# Patient Record
Sex: Female | Born: 1980 | Race: White | Hispanic: No | Marital: Married | Smoking: Never smoker
Health system: Southern US, Community
[De-identification: ages and names within clinical notes are randomized; demographics above are authoritative.]

## PROBLEM LIST (undated history)

## (undated) DIAGNOSIS — F99 Mental disorder, not otherwise specified: Secondary | ICD-10-CM

## (undated) DIAGNOSIS — M797 Fibromyalgia: Secondary | ICD-10-CM

## (undated) DIAGNOSIS — R87629 Unspecified abnormal cytological findings in specimens from vagina: Secondary | ICD-10-CM

## (undated) DIAGNOSIS — M519 Unspecified thoracic, thoracolumbar and lumbosacral intervertebral disc disorder: Secondary | ICD-10-CM

## (undated) DIAGNOSIS — M1990 Unspecified osteoarthritis, unspecified site: Secondary | ICD-10-CM

## (undated) HISTORY — PX: CRYOTHERAPY: SHX1416

## (undated) HISTORY — DX: Unspecified abnormal cytological findings in specimens from vagina: R87.629

## (undated) HISTORY — DX: Unspecified thoracic, thoracolumbar and lumbosacral intervertebral disc disorder: M51.9

## (undated) HISTORY — DX: Mental disorder, not otherwise specified: F99

---
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
## 2009-11-07 ENCOUNTER — Ambulatory Visit: Payer: Self-pay | Admitting: Obstetrics and Gynecology

## 2009-11-07 ENCOUNTER — Inpatient Hospital Stay (HOSPITAL_COMMUNITY): Admission: AD | Admit: 2009-11-07 | Discharge: 2009-11-07 | Payer: Self-pay | Admitting: Obstetrics & Gynecology

## 2010-05-04 ENCOUNTER — Inpatient Hospital Stay (HOSPITAL_COMMUNITY): Admission: AD | Admit: 2010-05-04 | Discharge: 2010-05-04 | Payer: Self-pay | Admitting: Obstetrics & Gynecology

## 2010-05-04 ENCOUNTER — Ambulatory Visit: Payer: Self-pay | Admitting: Family Medicine

## 2010-05-28 ENCOUNTER — Ambulatory Visit: Payer: Self-pay | Admitting: Family Medicine

## 2010-05-28 ENCOUNTER — Inpatient Hospital Stay (HOSPITAL_COMMUNITY): Admission: AD | Admit: 2010-05-28 | Discharge: 2010-05-29 | Payer: Self-pay | Admitting: Obstetrics & Gynecology

## 2010-06-27 ENCOUNTER — Ambulatory Visit: Payer: Self-pay | Admitting: Obstetrics and Gynecology

## 2010-06-27 ENCOUNTER — Inpatient Hospital Stay (HOSPITAL_COMMUNITY): Admission: AD | Admit: 2010-06-27 | Discharge: 2010-06-30 | Payer: Self-pay | Admitting: Obstetrics and Gynecology

## 2010-07-02 ENCOUNTER — Ambulatory Visit: Admission: RE | Admit: 2010-07-02 | Discharge: 2010-07-02 | Payer: Self-pay | Admitting: Obstetrics and Gynecology

## 2010-12-15 ENCOUNTER — Other Ambulatory Visit: Payer: Self-pay | Admitting: Neurosurgery

## 2010-12-15 DIAGNOSIS — M545 Low back pain, unspecified: Secondary | ICD-10-CM

## 2010-12-15 DIAGNOSIS — M792 Neuralgia and neuritis, unspecified: Secondary | ICD-10-CM

## 2010-12-18 ENCOUNTER — Other Ambulatory Visit: Payer: Self-pay | Admitting: Neurosurgery

## 2010-12-18 ENCOUNTER — Ambulatory Visit
Admission: RE | Admit: 2010-12-18 | Discharge: 2010-12-18 | Disposition: A | Discharge status: Home / Self Care | Payer: BC Managed Care – PPO | Source: Ambulatory Visit | Attending: Neurosurgery | Admitting: Neurosurgery

## 2010-12-18 DIAGNOSIS — M545 Low back pain, unspecified: Secondary | ICD-10-CM

## 2010-12-18 DIAGNOSIS — M792 Neuralgia and neuritis, unspecified: Secondary | ICD-10-CM

## 2011-01-07 LAB — CROSSMATCH
ABO/RH(D): AB NEG
Antibody Screen: NEGATIVE

## 2011-01-07 LAB — DIC (DISSEMINATED INTRAVASCULAR COAGULATION)PANEL
Fibrinogen: 409 mg/dL (ref 204–475)
INR: 0.98 (ref 0.00–1.49)
Platelets: 208 K/uL (ref 150–400)
Prothrombin Time: 13.2 s (ref 11.6–15.2)
Smear Review: NONE SEEN
aPTT: 23 s — ABNORMAL LOW (ref 24–37)

## 2011-01-07 LAB — RH IMMUNE GLOB WKUP(>/=20WKS)(NOT WOMEN'S HOSP): Fetal Screen: NEGATIVE

## 2011-01-07 LAB — CBC
HCT: 37.3 % (ref 36.0–46.0)
Hemoglobin: 13.1 g/dL (ref 12.0–15.0)
Hemoglobin: 8.6 g/dL — ABNORMAL LOW (ref 12.0–15.0)
MCH: 35.7 pg — ABNORMAL HIGH (ref 26.0–34.0)
MCHC: 35 g/dL (ref 30.0–36.0)
MCV: 101.9 fL — ABNORMAL HIGH (ref 78.0–100.0)
MCV: 103 fL — ABNORMAL HIGH (ref 78.0–100.0)
Platelets: 169 10*3/uL (ref 150–400)
Platelets: 202 10*3/uL (ref 150–400)
Platelets: 219 K/uL (ref 150–400)
RBC: 2.42 MIL/uL — ABNORMAL LOW (ref 3.87–5.11)
RBC: 3.66 MIL/uL — ABNORMAL LOW (ref 3.87–5.11)
RDW: 13.6 % (ref 11.5–15.5)
RDW: 13.6 % (ref 11.5–15.5)
WBC: 11.9 K/uL — ABNORMAL HIGH (ref 4.0–10.5)
WBC: 23.6 10*3/uL — ABNORMAL HIGH (ref 4.0–10.5)

## 2011-01-08 LAB — WET PREP, GENITAL
Clue Cells Wet Prep HPF POC: NONE SEEN
Trich, Wet Prep: NONE SEEN

## 2011-01-10 LAB — URINALYSIS, ROUTINE W REFLEX MICROSCOPIC
Bilirubin Urine: NEGATIVE
Glucose, UA: NEGATIVE mg/dL
Hgb urine dipstick: NEGATIVE
Ketones, ur: NEGATIVE mg/dL
Protein, ur: NEGATIVE mg/dL
pH: 7 (ref 5.0–8.0)

## 2011-01-10 LAB — RH IMMUNE GLOBULIN WORKUP (NOT WOMEN'S HOSP): Antibody Screen: NEGATIVE

## 2011-01-10 LAB — WET PREP, GENITAL
Clue Cells Wet Prep HPF POC: NONE SEEN
Trich, Wet Prep: NONE SEEN

## 2011-06-07 ENCOUNTER — Encounter (INDEPENDENT_AMBULATORY_CARE_PROVIDER_SITE_OTHER): Payer: Self-pay | Admitting: Internal Medicine

## 2011-06-07 ENCOUNTER — Ambulatory Visit (INDEPENDENT_AMBULATORY_CARE_PROVIDER_SITE_OTHER): Payer: BC Managed Care – PPO | Admitting: Internal Medicine

## 2011-06-07 ENCOUNTER — Telehealth (INDEPENDENT_AMBULATORY_CARE_PROVIDER_SITE_OTHER): Payer: Self-pay | Admitting: *Deleted

## 2011-06-07 VITALS — BP 132/80 | HR 80 | Temp 97.3°F | Ht 68.0 in | Wt 236.1 lb

## 2011-06-07 DIAGNOSIS — K625 Hemorrhage of anus and rectum: Secondary | ICD-10-CM

## 2011-06-07 NOTE — Telephone Encounter (Signed)
Flex sig sch'd 06/11/11 @ 3:30 (2:30)

## 2011-06-07 NOTE — Progress Notes (Signed)
Subjective:     Patient ID: Christie Morrow, female   DOB: 08/28/1981, 30 y.o.   MRN: [id number]  HPI Christie Morrow is a 30 yr old female referred to our office by Dr.Karen Miki Kins at [facility] in [place] for rectal bleeding. Her rectal bleeding actually started about 3 months ago.   She says she has past clots three times. Usually her stools are not hard.  She feels a lot of pressure in her rectum.  She has had 3 episodes of rectal bleeding. She denies seeing bright red blood on her stool or melena. She usually has a BM every 3-4 days. She recently had a baby September 2011.  Appetite is good. No weight loss.  She does have sharp rt lower abdominal pain which is not constant.  No family hx of colon cancer.   Stool today was soft, pencil thin.   She  says however her stools are usually normal and some are quite large.  She is on constipating medication  (Hydrocodone and Flexeril).  Review of Systems     Objective:   Physical Exam Alert and oriented. Skin warm and dry. Oral mucosa is moist. Natural teeth in good condition. Sclera anicteric, conjunctivae is pink. Thyroid not enlarged. No cervical lymphadenopathy. Lungs clear. Heart regular rate and rhythm.  Abdomen is soft. Bowel sounds are positive. No hepatomegaly. No abdominal masses felt.  Stool was guaiac positive today and bright red blood noted.  No tenderness.  No edema to lower extremities. Patient is alert and oriented.     Assessment:    Rectal bleeding.  Internal hemorrhoid AVM or colonic neoplasm needs to be ruled out      Plan:     Will obtain a CBC today and schedule a sigmoidoscopy with sedation. Stool softner BID   The risks and benefits such as perforation, bleeding, and infection were reviewed with the patient and is agreeable.

## 2011-06-08 ENCOUNTER — Telehealth (INDEPENDENT_AMBULATORY_CARE_PROVIDER_SITE_OTHER): Payer: Self-pay | Admitting: Internal Medicine

## 2011-06-08 LAB — CBC
MCH: 31.6 pg (ref 26.0–34.0)
MCHC: 34.2 g/dL (ref 30.0–36.0)
MCV: 92.2 fL (ref 78.0–100.0)
Platelets: 302 10*3/uL (ref 150–400)
RDW: 12.7 % (ref 11.5–15.5)

## 2011-06-08 NOTE — Telephone Encounter (Signed)
CBC results given to patient. Normal.  Scheduled for Sigmoid on Friday (06/11/2011).

## 2011-06-10 MED ORDER — SODIUM CHLORIDE 0.45 % IV SOLN
Freq: Once | INTRAVENOUS | Status: AC
  Administered 2011-06-11: 15:00:00 via INTRAVENOUS

## 2011-06-11 ENCOUNTER — Encounter (HOSPITAL_COMMUNITY): Payer: Self-pay

## 2011-06-11 ENCOUNTER — Ambulatory Visit (HOSPITAL_COMMUNITY)
Admission: RE | Admit: 2011-06-11 | Discharge: 2011-06-11 | Disposition: A | Discharge status: Home / Self Care | Payer: BC Managed Care – PPO | Source: Ambulatory Visit | Attending: Internal Medicine | Admitting: Internal Medicine

## 2011-06-11 ENCOUNTER — Encounter (HOSPITAL_COMMUNITY)
Admission: RE | Disposition: A | Discharge status: Home / Self Care | Payer: Self-pay | Source: Ambulatory Visit | Attending: Internal Medicine

## 2011-06-11 DIAGNOSIS — K644 Residual hemorrhoidal skin tags: Secondary | ICD-10-CM | POA: Insufficient documentation

## 2011-06-11 DIAGNOSIS — K921 Melena: Secondary | ICD-10-CM | POA: Insufficient documentation

## 2011-06-11 HISTORY — PX: FLEXIBLE SIGMOIDOSCOPY: SHX5431

## 2011-06-11 SURGERY — SIGMOIDOSCOPY, FLEXIBLE
Anesthesia: Moderate Sedation

## 2011-06-11 MED ORDER — MEPERIDINE HCL 25 MG/ML IJ SOLN
INTRAMUSCULAR | Status: DC | PRN
  Administered 2011-06-11 (×2): 25 mg via INTRAVENOUS

## 2011-06-11 MED ORDER — MIDAZOLAM HCL 5 MG/5ML IJ SOLN
INTRAMUSCULAR | Status: DC | PRN
  Administered 2011-06-11 (×2): 2 mg via INTRAVENOUS
  Administered 2011-06-11: 1 mg via INTRAVENOUS

## 2011-06-11 MED ORDER — MEPERIDINE HCL 50 MG/ML IJ SOLN
INTRAMUSCULAR | Status: AC
  Filled 2011-06-11: qty 1

## 2011-06-11 MED ORDER — MIDAZOLAM HCL 5 MG/5ML IJ SOLN
INTRAMUSCULAR | Status: AC
  Filled 2011-06-11: qty 5

## 2011-06-11 MED ORDER — STERILE WATER FOR IRRIGATION IR SOLN
Status: DC | PRN
  Administered 2011-06-11: 17:00:00

## 2011-06-11 NOTE — H&P (Signed)
Christie Morrow is an 30 y.o. female.   Chief Complaint: For flexible sigmoidoscopy HPI: Patient is a 30 year old Caucasian female who's been experiencing intermittent hematochezia for the last 6 months. It is always bright red blood in her bowel movement she's had some constipation but no diarrhea or abdominal pain. She has a good appetite. Family history is negative for inflammatory bowel disease  Past Medical History  Diagnosis Date  . Disc disorder     Past Surgical History  Procedure Date  . Wisdom tooth extraction 2003    History reviewed. No pertinent family history. Social History:  reports that she has never smoked. She does not have any smokeless tobacco history on file. She reports that she does not drink alcohol or use illicit drugs.  Allergies: No Known Allergies  Medications Prior to Admission  Medication Dose Route Frequency Provider Last Rate Last Dose  . 0.45 % sodium chloride infusion   Intravenous Once Malissa Hippo, MD 20 mL/hr at 06/11/11 1449    . meperidine (DEMEROL) 50 MG/ML injection           . midazolam (VERSED) 5 MG/5ML injection            No current outpatient prescriptions on file as of 06/11/2011.    No results found for this or any previous visit (from the past 48 hour(s)). No results found.  Review of Systems  Gastrointestinal: Positive for constipation. Negative for heartburn, nausea, vomiting, abdominal pain, diarrhea, blood in stool and melena.    Blood pressure 114/73, pulse 97, temperature 98.5 F (36.9 C), temperature source Oral, resp. rate 19, height 5\' 8"  (1.727 m), weight 236 lb (107.049 kg), last menstrual period 05/10/2010, SpO2 98.00%. Physical Exam  Constitutional: She appears well-developed and well-nourished.  HENT:  Mouth/Throat: Oropharynx is clear and moist.  Eyes: Conjunctivae are normal. No scleral icterus.  Neck: No thyromegaly present.  Cardiovascular: Normal rate, regular rhythm and normal heart sounds.   No murmur  heard. Respiratory: Breath sounds normal.  GI: Soft. She exhibits no distension and no mass. There is no tenderness.  Musculoskeletal: She exhibits no edema.  Lymphadenopathy:    She has no cervical adenopathy.  Neurological: She is alert.  Skin: Skin is warm and dry.     Assessment/Plan Recurrent hematochezia. for flexible sigmoidoscopy  Christie Morrow U 06/11/2011, 5:05 PM

## 2011-06-11 NOTE — Op Note (Signed)
FLEXIBLE SIGMOIDOSCOPY  PROCEDURE REPORT  PATIENT:  Christie Morrow  MR#:  [id number] Birthdate:  21-Apr-1981, 30 y.o., female Endoscopist:  Dr. Malissa Hippo, MD Referred By:  Ms. Marrian Salvage PA-C. Procedure Date: 06/11/2011  Procedure:   Flexible sigmoidoscopy  Indications: Recurrent hematochezia over the last 6 Months. Intermittent obstipation  Informed Consent: Procedure and risks were reviewed with the patient and informed consent obtained. Medications:  Demerol 50 mg IV Versed 5 mg IV  Description of procedure:  After a digital rectal exam was performed, that colonoscope was advanced from the anus through the rectum and colon to the area of splenic flexure. As the scope was gradually withdrawn colonic mucosa was carefully examined  was normal.  Preparation was satisfactory except some stool at rectosigmoid junction junction. Rectal mucosa was normal. Scope was retroflexed to examine the anorectal junction. Findings:   Small external hemorrhoids otherwise normal exam splenic flexure.   Therapeutic/Diagnostic Maneuvers Performed:  None  Complications:  None.    Impression:  External hemorrhoids otherwise normal exam to splenic flexure. Hemorrhoids felt to be source of hematochezia.   Recommendations:  High fiber diet. Fiber supplement 4 grams daily. Continue Colace 2 tablets daily Call  with progress report in 3 months  REHMAN,NAJEEB U  06/11/2011 5:26 PM  CC: Ms. Marrian Salvage PA-C.

## 2011-06-17 ENCOUNTER — Encounter (HOSPITAL_COMMUNITY): Payer: Self-pay | Admitting: Internal Medicine

## 2011-06-21 ENCOUNTER — Encounter (INDEPENDENT_AMBULATORY_CARE_PROVIDER_SITE_OTHER): Payer: Self-pay | Admitting: *Deleted

## 2011-06-21 ENCOUNTER — Telehealth (INDEPENDENT_AMBULATORY_CARE_PROVIDER_SITE_OTHER): Payer: Self-pay | Admitting: *Deleted

## 2011-06-21 NOTE — Telephone Encounter (Signed)
She started having diarrhea on Saturday. She had about 12 stools and all were loose. She also has been running a fever but no fever today. She has had 6 loose stools today.  I advised her Imodium BID.  She will call tomorrow. If she is still having loose stools I will get stool studies on her.  Stool WBC, O and P, C-diff, Stool culture and sensitive  Christie Morrow.

## 2011-06-21 NOTE — Telephone Encounter (Signed)
Christie Morrow CALLED IN STATES SHE IS HAVING WATERY DIARRHEA AND HAD A SLIGHT FEVER ON SAT NIGHT,SHE WANTED YOU TO PLEASE CALL HER BACK ON HER CELL PHONE # [phone]

## 2011-07-12 NOTE — Telephone Encounter (Signed)
This encounter was created in error - please disregard.

## 2013-04-05 ENCOUNTER — Emergency Department (HOSPITAL_COMMUNITY)
Admission: EM | Admit: 2013-04-05 | Discharge: 2013-04-05 | Disposition: A | Discharge status: Home / Self Care | Payer: BC Managed Care – PPO | Attending: Emergency Medicine | Admitting: Emergency Medicine

## 2013-04-05 ENCOUNTER — Emergency Department (HOSPITAL_COMMUNITY): Payer: BC Managed Care – PPO

## 2013-04-05 ENCOUNTER — Encounter (HOSPITAL_COMMUNITY): Payer: Self-pay | Admitting: *Deleted

## 2013-04-05 DIAGNOSIS — M7989 Other specified soft tissue disorders: Secondary | ICD-10-CM

## 2013-04-05 DIAGNOSIS — IMO0001 Reserved for inherently not codable concepts without codable children: Secondary | ICD-10-CM | POA: Insufficient documentation

## 2013-04-05 DIAGNOSIS — E876 Hypokalemia: Secondary | ICD-10-CM | POA: Insufficient documentation

## 2013-04-05 DIAGNOSIS — M549 Dorsalgia, unspecified: Secondary | ICD-10-CM | POA: Insufficient documentation

## 2013-04-05 DIAGNOSIS — Z791 Long term (current) use of non-steroidal anti-inflammatories (NSAID): Secondary | ICD-10-CM | POA: Insufficient documentation

## 2013-04-05 DIAGNOSIS — Z8739 Personal history of other diseases of the musculoskeletal system and connective tissue: Secondary | ICD-10-CM | POA: Insufficient documentation

## 2013-04-05 DIAGNOSIS — R0602 Shortness of breath: Secondary | ICD-10-CM | POA: Insufficient documentation

## 2013-04-05 DIAGNOSIS — M1710 Unilateral primary osteoarthritis, unspecified knee: Secondary | ICD-10-CM | POA: Insufficient documentation

## 2013-04-05 DIAGNOSIS — Z79899 Other long term (current) drug therapy: Secondary | ICD-10-CM | POA: Insufficient documentation

## 2013-04-05 DIAGNOSIS — R079 Chest pain, unspecified: Secondary | ICD-10-CM | POA: Insufficient documentation

## 2013-04-05 HISTORY — DX: Fibromyalgia: M79.7

## 2013-04-05 HISTORY — DX: Unspecified osteoarthritis, unspecified site: M19.90

## 2013-04-05 LAB — CBC WITH DIFFERENTIAL/PLATELET
Basophils Absolute: 0 10*3/uL (ref 0.0–0.1)
Eosinophils Absolute: 0.3 10*3/uL (ref 0.0–0.7)
Eosinophils Relative: 3 % (ref 0–5)
Hemoglobin: 12.9 g/dL (ref 12.0–15.0)
Lymphocytes Relative: 29 % (ref 12–46)
MCH: 31.9 pg (ref 26.0–34.0)
MCV: 93.3 fL (ref 78.0–100.0)
Monocytes Absolute: 0.6 10*3/uL (ref 0.1–1.0)
Neutrophils Relative %: 61 % (ref 43–77)
Platelets: 271 10*3/uL (ref 150–400)
RBC: 4.04 MIL/uL (ref 3.87–5.11)
WBC: 8.4 10*3/uL (ref 4.0–10.5)

## 2013-04-05 LAB — BASIC METABOLIC PANEL
Calcium: 9.1 mg/dL (ref 8.4–10.5)
GFR calc Af Amer: >90 mL/min (ref >90)
GFR calc non Af Amer: 87 mL/min — ABNORMAL LOW (ref >90)

## 2013-04-05 LAB — TROPONIN I: Troponin I: <0.3 ng/mL (ref <0.30)

## 2013-04-05 MED ORDER — HYDROMORPHONE HCL PF 1 MG/ML IJ SOLN: 1.0000 mg | Freq: Once | INTRAMUSCULAR | Status: AC

## 2013-04-05 MED ORDER — SODIUM CHLORIDE 0.9 % IV BOLUS (SEPSIS)
250.0000 mL | Freq: Once | INTRAVENOUS | Status: AC
  Administered 2013-04-05: 250 mL via INTRAVENOUS

## 2013-04-05 MED ORDER — POTASSIUM CHLORIDE CRYS ER 20 MEQ PO TBCR: 20.0000 meq | EXTENDED_RELEASE_TABLET | Freq: Two times a day (BID) | ORAL | Status: DC

## 2013-04-05 MED ORDER — HYDROMORPHONE HCL PF 1 MG/ML IJ SOLN
INTRAMUSCULAR | Status: AC
  Administered 2013-04-05: 1 mg via INTRAVENOUS
  Filled 2013-04-05: qty 1

## 2013-04-05 MED ORDER — ONDANSETRON HCL 4 MG/2ML IJ SOLN: 4.0000 mg | Freq: Once | INTRAMUSCULAR | Status: AC

## 2013-04-05 MED ORDER — HYDROCODONE-ACETAMINOPHEN 5-325 MG PO TABS: 1.0000 | ORAL_TABLET | Freq: Four times a day (QID) | ORAL | Status: DC | PRN

## 2013-04-05 MED ORDER — POTASSIUM CHLORIDE CRYS ER 20 MEQ PO TBCR
40.0000 meq | EXTENDED_RELEASE_TABLET | Freq: Once | ORAL | Status: AC
  Administered 2013-04-05: 40 meq via ORAL
  Filled 2013-04-05: qty 2

## 2013-04-05 MED ORDER — HYDROMORPHONE HCL PF 1 MG/ML IJ SOLN
1.0000 mg | Freq: Once | INTRAMUSCULAR | Status: AC
  Administered 2013-04-05: 1 mg via INTRAVENOUS
  Filled 2013-04-05: qty 1

## 2013-04-05 MED ORDER — ONDANSETRON HCL 4 MG/2ML IJ SOLN
INTRAMUSCULAR | Status: AC
  Administered 2013-04-05: 4 mg via INTRAVENOUS
  Filled 2013-04-05: qty 2

## 2013-04-05 MED ORDER — ONDANSETRON HCL 4 MG/2ML IJ SOLN
4.0000 mg | Freq: Once | INTRAMUSCULAR | Status: AC
  Administered 2013-04-05: 4 mg via INTRAVENOUS
  Filled 2013-04-05: qty 2

## 2013-04-05 MED ORDER — SODIUM CHLORIDE 0.9 % IV SOLN
INTRAVENOUS | Status: DC
  Administered 2013-04-05: 23:00:00 via INTRAVENOUS

## 2013-04-05 MED ORDER — HYDROCHLOROTHIAZIDE 25 MG PO TABS: 12.5000 mg | ORAL_TABLET | Freq: Every day | ORAL | Status: DC

## 2013-04-05 MED ORDER — IOHEXOL 350 MG/ML SOLN
100.0000 mL | Freq: Once | INTRAVENOUS | Status: AC | PRN
  Administered 2013-04-05: 100 mL via INTRAVENOUS

## 2013-04-05 NOTE — ED Notes (Signed)
Pt states swelling to bilateral legs since last Thursday with pain, SOB at times, states feet swelling worse this morning, feels like chest is tight

## 2013-04-05 NOTE — ED Notes (Signed)
Pt requesting more pain meds

## 2013-04-05 NOTE — ED Notes (Signed)
Patient states she does not need anything at this time. 

## 2013-04-05 NOTE — ED Provider Notes (Addendum)
History  This chart was scribed for Shelda Jakes, MD by Ardelia Mems, ED Scribe. This patient was seen in room [facility]18[facility] and the patient's care was started at 7:08 PM.   CSN: [id number]  Arrival date & time 04/05/13  1702     Chief Complaint  Patient presents with  . Leg Swelling  . Shortness of Breath     The history is provided by the patient. No language interpreter was used.    HPI Comments: Christie Morrow is a 32 y.o. female who presents to the Emergency Department complaining of bilateral leg swelling with associated constant, moderate pain onset 7 days ago which suddenly worsened this morning. Pt states that left side is more swollen than right side. Pt states that her hands are also swollen. Pt reports mild, intermittent SOB as an associated symptom. Pt states starting this morning she had SOB and had 5/10 severity chest pain described as discomfort, worsened with deep inspiration. Pt states that she spoke to her PCP 3 days ago and was told to begin a low sodium diet and drink plenty of fluids. LNMP was 1 month ago. Pt had a IUD put in 1 month ago. Pt states that she sees a rheumatologist for arthritis of her knees and fibromyalgia. Pt denies fever, diarrhea, headaches or any other symptoms. Pt denies alcohol use and smoking.  PCP- Dr. Marrian Salvage in [place]   Past Medical History  Diagnosis Date  . Disc disorder   . Fibromyalgia   . Arthritis     Past Surgical History  Procedure Laterality Date  . Wisdom tooth extraction  2003  . Flexible sigmoidoscopy  06/11/2011    Procedure: FLEXIBLE SIGMOIDOSCOPY;  Surgeon: Malissa Hippo, MD;  Location: [facility];  Service: Endoscopy;  Laterality: N/A;  3:30    No family history on file.  History  Substance Use Topics  . Smoking status: Never Smoker   . Smokeless tobacco: Not on file  . Alcohol Use: No    OB History   Grav Para Term Preterm Abortions TAB SAB Ect Mult Living                  Review  of Systems  Constitutional: Negative for fever and chills.  HENT: Negative for sore throat, rhinorrhea and neck pain.   Eyes: Negative for visual disturbance.  Respiratory: Positive for shortness of breath. Negative for cough.   Cardiovascular: Positive for chest pain and leg swelling.  Gastrointestinal: Negative for nausea, vomiting, abdominal pain and diarrhea.  Genitourinary: Negative for dysuria and hematuria.  Musculoskeletal: Positive for back pain and arthralgias.  Skin: Negative for rash.  Neurological: Negative for headaches.  Hematological: Does not bruise/bleed easily.  Psychiatric/Behavioral: Negative for confusion.   A complete 10 system review of systems was obtained and all systems are negative except as noted in the HPI and PMH.   Allergies  Review of patient's allergies indicates no known allergies.  Home Medications   Current Outpatient Rx  Name  Route  Sig  Dispense  Refill  . ALPRAZolam (XANAX) 0.5 MG tablet   Oral   Take 0.5 mg by mouth at bedtime as needed for sleep.         Marland Kitchen buPROPion (WELLBUTRIN SR) 150 MG 12 hr tablet   Oral   Take 150 mg by mouth daily.         . cyclobenzaprine (FLEXERIL) 10 MG tablet   Oral   Take 10 mg by mouth 3 (  three) times daily as needed for muscle spasms.         . diclofenac sodium (VOLTAREN) 1 % GEL   Topical   Apply 4 g topically at bedtime.         . DULoxetine (CYMBALTA) 60 MG capsule   Oral   Take 60 mg by mouth daily.         . hydrochlorothiazide (HYDRODIURIL) 25 MG tablet   Oral   Take 25 mg by mouth daily.         . phentermine (ADIPEX-P) 37.5 MG tablet   Oral   Take 37.5 mg by mouth daily.         . traMADol (ULTRAM) 50 MG tablet   Oral   Take 50 mg by mouth every 6 (six) hours as needed for pain.         . Vitamin D, Ergocalciferol, (DRISDOL) 50000 UNITS CAPS   Oral   Take 50,000 Units by mouth every 7 (seven) days. On Friday         . hydrochlorothiazide (HYDRODIURIL) 25 MG  tablet   Oral   Take 0.5 tablets (12.5 mg total) by mouth daily.   14 tablet   1   . HYDROcodone-acetaminophen (NORCO/VICODIN) 5-325 MG per tablet   Oral   Take 1-2 tablets by mouth every 6 (six) hours as needed for pain.   20 tablet   0   . potassium chloride SA (K-DUR,KLOR-CON) 20 MEQ tablet   Oral   Take 1 tablet (20 mEq total) by mouth 2 (two) times daily.   10 tablet   0     Triage Vitals: BP 109/64  Pulse 95  Temp(Src) 97.4 F (36.3 C) (Oral)  Resp 18  Ht 5' 7.5" (1.715 m)  Wt 277 lb (125.646 kg)  BMI 42.72 kg/m2  SpO2 100%  Physical Exam  Nursing note and vitals reviewed. Constitutional: She is oriented to person, place, and time. She appears well-developed and well-nourished.  HENT:  Head: Normocephalic and atraumatic.  Mouth/Throat: Oropharynx is clear and moist.  Eyes: EOM are normal. Pupils are equal, round, and reactive to light.  Neck: Normal range of motion. Neck supple. No tracheal deviation present.  Cardiovascular: Normal rate, regular rhythm, normal heart sounds and intact distal pulses.   No murmur heard. Pulmonary/Chest: Effort normal and breath sounds normal. No respiratory distress.  Abdominal: Soft. Bowel sounds are normal. There is no tenderness.  Musculoskeletal: Normal range of motion. She exhibits no tenderness.  Knees are not warm to touch. Swelling and trace pitting edema to the knees bilaterally.  Neurological: She is alert and oriented to person, place, and time.  Skin: Skin is warm and dry. No rash noted.    ED Course  Procedures (including critical care time)  DIAGNOSTIC STUDIES: Oxygen Saturation is 100% on RA, normal by my interpretation.    COORDINATION OF CARE: 7:30 PM- Pt advised of plan for treatment and pt agrees.   Medications  0.9 %  sodium chloride infusion (not administered)  HYDROmorphone (DILAUDID) injection 1 mg (not administered)  ondansetron (ZOFRAN) injection 4 mg (not administered)  HYDROmorphone  (DILAUDID) 1 MG/ML injection (not administered)  ondansetron (ZOFRAN) 4 MG/2ML injection (not administered)  potassium chloride SA (K-DUR,KLOR-CON) CR tablet 40 mEq (not administered)  sodium chloride 0.9 % bolus 250 mL (0 mLs Intravenous Stopped 04/05/13 2120)  ondansetron (ZOFRAN) injection 4 mg (4 mg Intravenous Given 04/05/13 2014)  HYDROmorphone (DILAUDID) injection 1 mg (1 mg Intravenous Given 04/05/13  2015)  iohexol (OMNIPAQUE) 350 MG/ML injection 100 mL (100 mLs Intravenous Contrast Given 04/05/13 2211)     Labs Reviewed  BASIC METABOLIC PANEL - Abnormal; Notable for the following:    Potassium 3.1 (*)    GFR calc non Af Amer 87 (*)    All other components within normal limits  CBC WITH DIFFERENTIAL  TROPONIN I   Ct Angio Chest Pe W/cm &/or Wo Cm  04/05/2013   *RADIOLOGY REPORT*  Clinical Data: Leg swelling.  Chest pain.  Shortness of breath. The field  CT ANGIOGRAPHY CHEST  Technique:  Multidetector CT imaging of the chest using the standard protocol during bolus administration of intravenous contrast. Multiplanar reconstructed images including MIPs were obtained and reviewed to evaluate the vascular anatomy.  Contrast: OMNIPAQUE IOHEXOL 350 MG/ML SOLN  Comparison: None.  Findings: Technically adequate study with moderately good opacification of the central and proximal segmental pulmonary arteries.  Distal pulmonary arteries are not as well opacified.  No focal filling defect is demonstrated suggesting no evidence of significant central pulmonary embolus.  Small peripheral emboli cannot be entirely excluded based on this study.  Normal heart size.  Normal caliber thoracic aorta.  No significant lymphadenopathy in the chest.  Esophagus is mostly decompressed. Limited visualization of the lungs due to respiratory motion artifact but there appears to be dependent changes in the lungs. Mild posterior edema is not excluded.  No focal consolidation.  No pneumothorax.  No pleural effusions.   Visualized portions of the upper abdominal organs are grossly unremarkable.  Normal alignment of the thoracic vertebrae.  IMPRESSION: No evidence of significant central pulmonary embolus peripheral vessels are not well opacified.  Posterior opacities in the lungs likely represent dependent changes although edema is not excluded.   Original Report Authenticated By: Burman Nieves, M.D.     Date: 04/05/2013  Rate: 97  Rhythm: normal sinus rhythm  QRS Axis: normal  Intervals: normal  ST/T Wave abnormalities: normal  Conduction Disutrbances:none  Narrative Interpretation:   Old EKG Reviewed: none available  Results for orders placed during the hospital encounter of 04/05/13  CBC WITH DIFFERENTIAL      Result Value Range   WBC 8.4  4.0 - 10.5 K/uL   RBC 4.04  3.87 - 5.11 MIL/uL   Hemoglobin 12.9  12.0 - 15.0 g/dL   HCT 78.2  95.6 - 21.3 %   MCV 93.3  78.0 - 100.0 fL   MCH 31.9  26.0 - 34.0 pg   MCHC 34.2  30.0 - 36.0 g/dL   RDW 08.6  57.8 - 46.9 %   Platelets 271  150 - 400 K/uL   Neutrophils Relative % 61  43 - 77 %   Neutro Abs 5.1  1.7 - 7.7 K/uL   Lymphocytes Relative 29  12 - 46 %   Lymphs Abs 2.5  0.7 - 4.0 K/uL   Monocytes Relative 7  3 - 12 %   Monocytes Absolute 0.6  0.1 - 1.0 K/uL   Eosinophils Relative 3  0 - 5 %   Eosinophils Absolute 0.3  0.0 - 0.7 K/uL   Basophils Relative 0  0 - 1 %   Basophils Absolute 0.0  0.0 - 0.1 K/uL  BASIC METABOLIC PANEL      Result Value Range   Sodium 136  135 - 145 mEq/L   Potassium 3.1 (*) 3.5 - 5.1 mEq/L   Chloride 98  96 - 112 mEq/L   CO2 29  19 -  32 mEq/L   Glucose, Bld 96  70 - 99 mg/dL   BUN 6  6 - 23 mg/dL   Creatinine, Ser 9.14  0.50 - 1.10 mg/dL   Calcium 9.1  8.4 - 78.2 mg/dL   GFR calc non Af Amer 87 (*) >90 mL/min   GFR calc Af Amer >90  >90 mL/min  TROPONIN I      Result Value Range   Troponin I <0.30  <0.30 ng/mL     1. Shortness of breath   2. Right leg swelling   3. Left leg swelling   4. Hypokalemia        MDM  Patient with complaint of bilateral lower leg swelling. Some shortness of breath. Doubt deep vein thrombosis however have ordered CT angios chest to rule out pulmonary embolism. Doppler studies are not available no evidence of any renal insufficiency no evidence of an acute cardiac event. No evidence of anemia. CT angios also work to evaluate the rest of the pulmonary system. If negative will discharge and followup with primary care Dr.  Patient with mild hypokalemia. Since we starting on a diuretic we'll start oral potassium supplements first dose given here in the ED. Patient will followup with her primary care Dr. will start the hydrochlorothiazide 12.5 mg daily. Be important for the patient to have a recheck of her potassium the next few days. CT scan no pulmonary embolism but does raise some question may be of some mild edema but with leg swelling the diuretic will be helpful. No evidence of pneumonia no evidence of pleural effusion no evidence of any renal problems.    Correction: Patient is already on hydrochlorothiazide still taking it because she had stopped it so taking the 12 and half of the hydrocortisone as I would not be necessary. Patient will continue her current diuretic and followup with her primary care doctor and take potassium supplements.    I personally performed the services described in this documentation, which was scribed in my presence. The recorded information has been reviewed and is accurate.     Shelda Jakes, MD 04/05/13 2231  Shelda Jakes, MD 04/05/13 2236

## 2013-04-05 NOTE — ED Notes (Signed)
Pt dc'd by Tenna Delaine, RN but not taken off track board.  Pt chart removed from track board at this time.

## 2013-04-05 NOTE — ED Notes (Signed)
Pt waiting on c-t 

## 2013-08-31 ENCOUNTER — Ambulatory Visit (INDEPENDENT_AMBULATORY_CARE_PROVIDER_SITE_OTHER): Payer: Self-pay | Admitting: Surgery

## 2013-10-04 ENCOUNTER — Ambulatory Visit (INDEPENDENT_AMBULATORY_CARE_PROVIDER_SITE_OTHER): Payer: Self-pay | Admitting: Surgery

## 2013-10-10 ENCOUNTER — Encounter (INDEPENDENT_AMBULATORY_CARE_PROVIDER_SITE_OTHER): Payer: Self-pay | Admitting: Surgery

## 2014-05-19 IMAGING — CT CT ANGIO CHEST
1 of 6 series · 5 of 36 positions shown · IV contrast (Omnipaque 300)
Comparison: None.

CLINICAL DATA: Leg swelling.  Chest pain.  Shortness of breath.
The field

CT ANGIOGRAPHY CHEST
TECHNIQUE: Multidetector CT imaging of the chest using the
standard protocol during bolus administration of intravenous
contrast. Multiplanar reconstructed images including MIPs were
obtained and reviewed to evaluate the vascular anatomy.
Contrast: 100mL OMNIPAQUE IOHEXOL 350 MG/ML SOLN

[Series 5: pe 3.0 b40f · axial · 0.74mm/px · z∈[-328,-160]mm · 5 of 85 slices shown]
[im 15/85  lung]
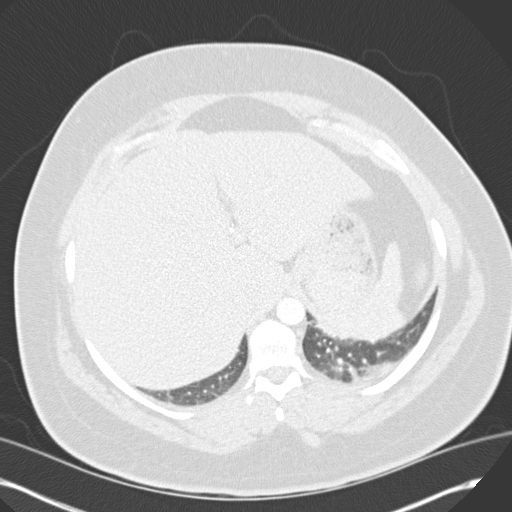
[im 29/85  mediastinal]
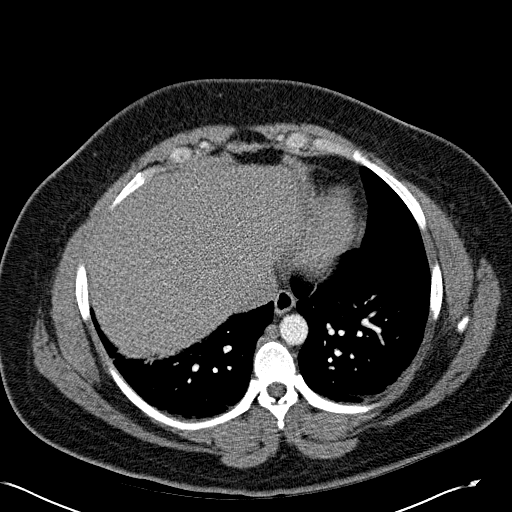
[im 43/85  lung]
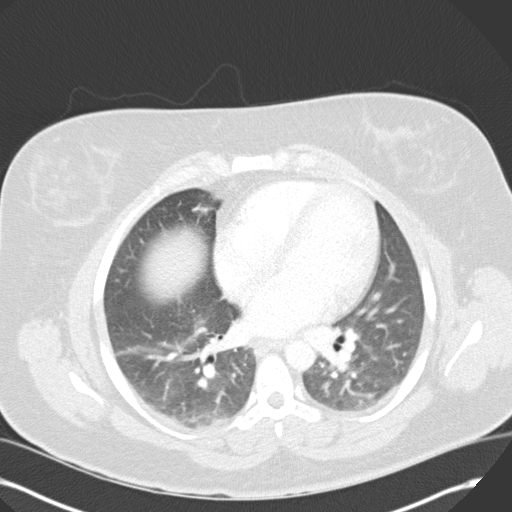
[im 57/85  mediastinal]
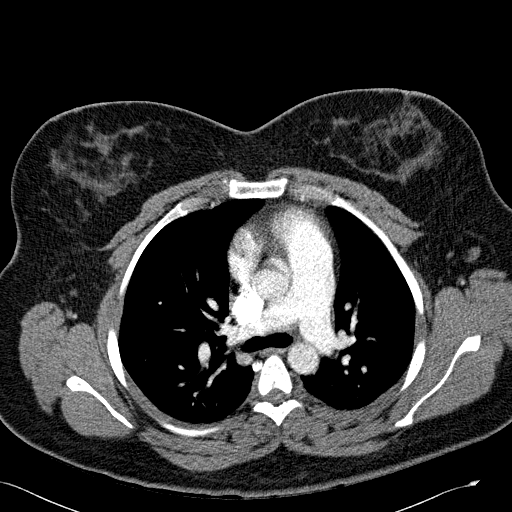
[im 71/85  lung]
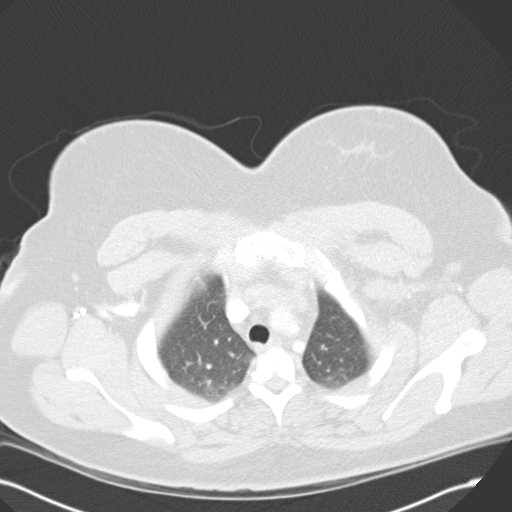

[5 of 36 positions shown; findings below may reference images not displayed]

FINDINGS: Technically adequate study with moderately good
opacification of the central and proximal segmental pulmonary
arteries.  Distal pulmonary arteries are not as well opacified.  No
focal filling defect is demonstrated suggesting no evidence of
significant central pulmonary embolus.  Small peripheral emboli
cannot be entirely excluded based on this study.

Normal heart size.  Normal caliber thoracic aorta.  No significant
lymphadenopathy in the chest.  Esophagus is mostly decompressed.
Limited visualization of the lungs due to respiratory motion
artifact but there appears to be dependent changes in the lungs.
Mild posterior edema is not excluded.  No focal consolidation.  No
pneumothorax.  No pleural effusions.  Visualized portions of the
upper abdominal organs are grossly unremarkable.  Normal alignment
of the thoracic vertebrae.
IMPRESSION: No evidence of significant central pulmonary embolus peripheral
vessels are not well opacified.  Posterior opacities in the lungs
likely represent dependent changes although edema is not excluded.

## 2015-10-06 ENCOUNTER — Encounter: Payer: Self-pay | Admitting: Family Medicine

## 2015-10-06 ENCOUNTER — Ambulatory Visit (INDEPENDENT_AMBULATORY_CARE_PROVIDER_SITE_OTHER): Payer: BLUE CROSS/BLUE SHIELD | Admitting: Family Medicine

## 2015-10-06 VITALS — BP 132/90 | Ht 67.0 in | Wt 315.2 lb

## 2015-10-06 DIAGNOSIS — E785 Hyperlipidemia, unspecified: Secondary | ICD-10-CM | POA: Diagnosis not present

## 2015-10-06 DIAGNOSIS — M797 Fibromyalgia: Secondary | ICD-10-CM | POA: Diagnosis not present

## 2015-10-06 DIAGNOSIS — F329 Major depressive disorder, single episode, unspecified: Secondary | ICD-10-CM | POA: Diagnosis not present

## 2015-10-06 DIAGNOSIS — E559 Vitamin D deficiency, unspecified: Secondary | ICD-10-CM | POA: Diagnosis not present

## 2015-10-06 DIAGNOSIS — Z79899 Other long term (current) drug therapy: Secondary | ICD-10-CM | POA: Diagnosis not present

## 2015-10-06 DIAGNOSIS — I878 Other specified disorders of veins: Secondary | ICD-10-CM | POA: Insufficient documentation

## 2015-10-06 DIAGNOSIS — G4700 Insomnia, unspecified: Secondary | ICD-10-CM

## 2015-10-06 DIAGNOSIS — R5383 Other fatigue: Secondary | ICD-10-CM

## 2015-10-06 DIAGNOSIS — I10 Essential (primary) hypertension: Secondary | ICD-10-CM | POA: Insufficient documentation

## 2015-10-06 DIAGNOSIS — F32A Depression, unspecified: Secondary | ICD-10-CM

## 2015-10-06 MED ORDER — POTASSIUM CHLORIDE CRYS ER 20 MEQ PO TBCR: 20.0000 meq | EXTENDED_RELEASE_TABLET | Freq: Every day | ORAL | Status: DC

## 2015-10-06 MED ORDER — BUPROPION HCL ER (XL) 300 MG PO TB24: 300.0000 mg | ORAL_TABLET | Freq: Every day | ORAL | Status: DC

## 2015-10-06 MED ORDER — CYCLOBENZAPRINE HCL 10 MG PO TABS: 10.0000 mg | ORAL_TABLET | Freq: Three times a day (TID) | ORAL | Status: DC | PRN

## 2015-10-06 MED ORDER — FUROSEMIDE 40 MG PO TABS: 40.0000 mg | ORAL_TABLET | Freq: Every day | ORAL | Status: DC

## 2015-10-06 MED ORDER — DULOXETINE HCL 60 MG PO CPEP: 60.0000 mg | ORAL_CAPSULE | Freq: Every day | ORAL | Status: DC

## 2015-10-06 MED ORDER — ALPRAZOLAM 1 MG PO TABS: 1.0000 mg | ORAL_TABLET | Freq: Every day | ORAL | Status: DC

## 2015-10-06 NOTE — Progress Notes (Signed)
   Subjective:    Patient ID: Christie GrimLanna G Morrow, female    DOB: [birth year], 34 y.o.   MRN: [id number]  patient arrives office with numerous concerns and is a new patient. HPI Patient arrives to get established and refills of current meds. Patient states she feels like her lasix is not working and she is having a lot of water weight gain.  overll health has been good until lately  2013 to 2014 fibromyalgia. Long-standing history of fibromyalgia. Has seen a rheumatologist in the past. Dr. Lannette Donatheborah Schwark. Due to see again. and osteoarthritis  Pt has also hx of disc disease  Pt takes cymbalta for the fibromyalgia , states overall that it helps.   Uses voltaren gel topical, hurts the worse at night  Hx of hairdressing,  And also  Pt prescribed cymbalta by dr Tarri Fullernahoney in [place]  At one pt the hctz stopped working then switched to lasic X needs daily diuretics control blood pressure. Had to switch to Lasix 20 to control edema. States needs higher dose and Lasix 20. Currently not taking the potassium medicine.    insomnia ongoing challenges deftly needs Xanax for this. Has been out lately  Pt has swelling and edema that fluctuates  Hx of worsening weight , patient realizes she is overweight but also thinks a lot of it is fluid.    Thyroid troubles none in family. Pos diabetes. Pos excess cholesterol high blood pressure issues   Review of Systems  positive muscle ache positive gradual weight gain diminished energy no cough no chest pain complete ROS otherwise negative    Objective:   Physical Exam   alert vitals stable blood pressure elevated on repeat. Morbid obesity present HEENT normal sclera heart rare rhythm ankles  Mild 1+ edema bilateral pulses good sensation intact diffuse tender>      Assessment & Plan:   impression 1 morbid obesity discussed at length #2 peripheral edema likely venous stasis discussed #3 hypertension control decent though not ideal #4 fibromyalgia  ongoing multiple meds over 5 depression anxiety substantial discussed no suicidal thoughts. States current medicines helping some but not as much plan diet discussed exercise discussed. Chronic medications refilled. Appropriate blood work. 35- minutes spent most in discussion further recommendations based on bloodwork recheck in several months. Patient also [facility]

## 2015-10-29 ENCOUNTER — Ambulatory Visit (INDEPENDENT_AMBULATORY_CARE_PROVIDER_SITE_OTHER): Payer: BLUE CROSS/BLUE SHIELD | Admitting: Family Medicine

## 2015-10-29 ENCOUNTER — Encounter: Payer: Self-pay | Admitting: Family Medicine

## 2015-10-29 VITALS — BP 100/70 | Temp 99.0°F | Wt 305.0 lb

## 2015-10-29 DIAGNOSIS — J329 Chronic sinusitis, unspecified: Secondary | ICD-10-CM | POA: Diagnosis not present

## 2015-10-29 DIAGNOSIS — J029 Acute pharyngitis, unspecified: Secondary | ICD-10-CM

## 2015-10-29 LAB — POCT RAPID STREP A (OFFICE): RAPID STREP A SCREEN: NEGATIVE

## 2015-10-29 MED ORDER — AMOXICILLIN-POT CLAVULANATE 875-125 MG PO TABS: 1.0000 | ORAL_TABLET | Freq: Two times a day (BID) | ORAL | Status: DC

## 2015-10-29 MED ORDER — HYDROCODONE-HOMATROPINE 5-1.5 MG/5ML PO SYRP: 5.0000 mL | ORAL_SOLUTION | Freq: Every evening | ORAL | Status: DC | PRN

## 2015-10-29 NOTE — Progress Notes (Signed)
   Subjective:    Patient ID: Christie Morrow, female    DOB: 05-31-1981, 35 y.o.   MRN: [id number]  Cough This is a new problem. The current episode started 1 to 4 weeks ago. The problem has been unchanged. The cough is productive of sputum. Associated symptoms include a sore throat. Nothing aggravates the symptoms. Treatments tried: nyquil. The treatment provided no relief.    Headache , frontal in nature. Worse with change of position  Frontal fr coughing  Pos gunky phlegm. Cough day and night. Worse at night. Next  No wheezing Review of Systems  HENT: Positive for sore throat.   Respiratory: Positive for cough.    low-grade fever and diminished energy     Objective:   Physical Exam  Alert, mild malaise. Hydration good Vitals stable. frontal/ maxillary tenderness evident positive nasal congestion. pharynx normal neck supple  lungs clear/no crackles or wheezes. heart regular in rhythm       Assessment & Plan:  Impression rhinosinusitis plus element of bronchitis likely post viral, discussed with patient. plan antibiotics prescribed. Questions answered. Symptomatic care discussed. warning signs discussed. [facility]

## 2015-10-30 LAB — STREP A DNA PROBE: STREP GP A DIRECT, DNA PROBE: NEGATIVE

## 2015-11-06 ENCOUNTER — Telehealth: Payer: Self-pay | Admitting: Family Medicine

## 2015-11-06 NOTE — Telephone Encounter (Signed)
Still having sinus sx and cough and request a different antibiotic-saw no real improvement

## 2015-11-06 NOTE — Telephone Encounter (Signed)
Pt is also requesting some more medicine for the cough. Pt states she is about out of what she was given for the cough.

## 2015-11-06 NOTE — Telephone Encounter (Signed)
What symptoms is she still having? Did anything improve?

## 2015-11-06 NOTE — Telephone Encounter (Signed)
Pt called stating that her antibiotic that she was recently prescribed is not working and would like for something else to be called in if possible.    [facility]

## 2015-11-06 NOTE — Telephone Encounter (Signed)
Patient given augmentin 10/29/15- dx with sinusitis with an element of bronchitis

## 2015-11-07 ENCOUNTER — Other Ambulatory Visit: Payer: Self-pay | Admitting: Nurse Practitioner

## 2015-11-07 MED ORDER — CEFDINIR 300 MG PO CAPS: 300.0000 mg | ORAL_CAPSULE | Freq: Two times a day (BID) | ORAL | Status: DC

## 2015-11-07 MED ORDER — HYDROCODONE-HOMATROPINE 5-1.5 MG/5ML PO SYRP: 5.0000 mL | ORAL_SOLUTION | Freq: Every evening | ORAL | Status: DC | PRN

## 2015-11-07 NOTE — Telephone Encounter (Signed)
Left message on voicemail notifying patient that antibiotic sent to pharmacy and script for cough med is at front desk.

## 2015-11-07 NOTE — Telephone Encounter (Signed)
Sent in antibiotic and printed Rx for cough syrup. Call back if persists.

## 2015-11-12 ENCOUNTER — Ambulatory Visit (INDEPENDENT_AMBULATORY_CARE_PROVIDER_SITE_OTHER): Payer: BLUE CROSS/BLUE SHIELD | Admitting: Family Medicine

## 2015-11-12 ENCOUNTER — Encounter: Payer: Self-pay | Admitting: Family Medicine

## 2015-11-12 VITALS — BP 114/72 | Temp 98.8°F | Ht 67.0 in | Wt 314.2 lb

## 2015-11-12 DIAGNOSIS — J329 Chronic sinusitis, unspecified: Secondary | ICD-10-CM

## 2015-11-12 DIAGNOSIS — J4520 Mild intermittent asthma, uncomplicated: Secondary | ICD-10-CM | POA: Diagnosis not present

## 2015-11-12 DIAGNOSIS — J683 Other acute and subacute respiratory conditions due to chemicals, gases, fumes and vapors: Secondary | ICD-10-CM

## 2015-11-12 MED ORDER — ALBUTEROL SULFATE HFA 108 (90 BASE) MCG/ACT IN AERS: 2.0000 | INHALATION_SPRAY | RESPIRATORY_TRACT | Status: DC | PRN

## 2015-11-12 MED ORDER — PREDNISONE 20 MG PO TABS: ORAL_TABLET | ORAL | Status: DC

## 2015-11-12 MED ORDER — HYDROCODONE-HOMATROPINE 5-1.5 MG/5ML PO SYRP: 5.0000 mL | ORAL_SOLUTION | Freq: Every evening | ORAL | Status: DC | PRN

## 2015-11-12 NOTE — Progress Notes (Signed)
   Subjective:    Patient ID: Christie Morrow, female    DOB: [birth year], 35 y.o.   MRN: [id number]  Cough This is a new problem. The current episode started 1 to 4 weeks ago. The problem has been unchanged. The cough is productive of sputum. Associated symptoms include headaches, a sore throat and wheezing. Nothing aggravates the symptoms. Treatments tried: antibiotics. The treatment provided no relief.   Patient has no other concerns at this time.  Bad aching and pain in nmid back shoulder reg   patient returns with ongoing cough congestion drainage. Cough is day and night. Notes wheezing with exertion notes wheezing at nighttime. Patient has never experienced wheezing before  The aug helped a little but not much, ongoing cough is bad Review of Systems  HENT: Positive for sore throat.   Respiratory: Positive for cough and wheezing.   Neurological: Positive for headaches.       Objective:   Physical Exam  Alert moderate malaise. Vital stable HET moderate his congestion lungs impressive bilateral wheezes no tachypnea no inspiratory crackles      Assessment & Plan:  Impression persistent rhinitis sinusitis bronchitis with element of substantial reactive airways plan inhaler use discussed and prescribed. Prednisone taper antibiotics prescribed WSL

## 2015-11-19 ENCOUNTER — Telehealth: Payer: Self-pay | Admitting: Family Medicine

## 2015-11-19 ENCOUNTER — Other Ambulatory Visit: Payer: Self-pay | Admitting: *Deleted

## 2015-11-19 MED ORDER — ZOLPIDEM TARTRATE 5 MG PO TABS: 5.0000 mg | ORAL_TABLET | Freq: Every evening | ORAL | Status: DC | PRN

## 2015-11-19 MED ORDER — ALPRAZOLAM 1 MG PO TABS: ORAL_TABLET | ORAL | Status: DC

## 2015-11-19 NOTE — Telephone Encounter (Signed)
meds sent to pharm. Pt notified.  

## 2015-11-19 NOTE — Telephone Encounter (Signed)
Ok per dr Delorise Royals  #30 one qhs. One additoinal refill. Needs ov before further refills. Change directions on xanax to one half bid #30. Cancel rx that has directions one qhs. Discussed with pt. Pt verblalized understanding.

## 2015-11-19 NOTE — Telephone Encounter (Signed)
Patient is living in [place] and needing new prescription for Ambien 5 mg called into [facility] there. PH#[phone]

## 2015-11-19 NOTE — Telephone Encounter (Signed)
[facility] [address] [place], [place].

## 2015-12-17 ENCOUNTER — Telehealth: Payer: Self-pay | Admitting: Family Medicine

## 2015-12-17 ENCOUNTER — Other Ambulatory Visit: Payer: Self-pay | Admitting: *Deleted

## 2015-12-17 MED ORDER — ALPRAZOLAM 1 MG PO TABS: ORAL_TABLET | ORAL | Status: DC

## 2015-12-17 NOTE — Telephone Encounter (Signed)
Pt is requesting refills on her xanax sent into [facility] in [place]

## 2015-12-17 NOTE — Telephone Encounter (Signed)
Med sent to pharm. Discussed with pt. She states they are back and forth in [place] but more time here and still comes here for primary care.

## 2015-12-17 NOTE — Telephone Encounter (Signed)
On dec 12 qrote thirty plus five ref why out now? Call pharm and get hx

## 2015-12-17 NOTE — Telephone Encounter (Signed)
Xanax thirty tab same strength one half up to bid prn anxiety, drowsy prec, plus two monthly ref, if pt moved to [place]?will need prim care doc there to [facility] controlled substances long term

## 2015-12-17 NOTE — Telephone Encounter (Signed)
See telephone note on 11/19/15. Pt called on day you were off and wanted rx for ambien. Dr Lorin Picket ok Remus Loffler but wanted to change directions on xanax from one qhs to one half bid. Still quantity of 30.

## 2016-01-01 ENCOUNTER — Telehealth: Payer: Self-pay | Admitting: Family Medicine

## 2016-01-01 MED ORDER — BUPROPION HCL ER (XL) 300 MG PO TB24: 300.0000 mg | ORAL_TABLET | Freq: Every day | ORAL | Status: DC

## 2016-01-01 NOTE — Telephone Encounter (Signed)
Rx sent electronically to pharmacy. Patient notified and stated she has a follow up office visit scheduled next week.

## 2016-01-01 NOTE — Telephone Encounter (Signed)
buPROPion (WELLBUTRIN XL) 300 MG 24 hr tablet  Pt needs refill on this med [facility]   Has upcoming appt but will be out before then

## 2016-01-05 ENCOUNTER — Ambulatory Visit: Payer: BLUE CROSS/BLUE SHIELD | Admitting: Family Medicine

## 2016-01-06 ENCOUNTER — Ambulatory Visit: Payer: BLUE CROSS/BLUE SHIELD | Admitting: Family Medicine

## 2016-01-12 ENCOUNTER — Ambulatory Visit: Payer: BLUE CROSS/BLUE SHIELD | Admitting: Family Medicine

## 2016-01-13 ENCOUNTER — Other Ambulatory Visit: Payer: Self-pay | Admitting: Family Medicine

## 2016-01-14 NOTE — Telephone Encounter (Signed)
Ok six mo worh

## 2016-01-22 ENCOUNTER — Ambulatory Visit: Payer: BLUE CROSS/BLUE SHIELD | Admitting: Family Medicine

## 2016-01-26 ENCOUNTER — Encounter: Payer: Self-pay | Admitting: Family Medicine

## 2016-01-26 ENCOUNTER — Ambulatory Visit (INDEPENDENT_AMBULATORY_CARE_PROVIDER_SITE_OTHER): Payer: BLUE CROSS/BLUE SHIELD | Admitting: Family Medicine

## 2016-01-26 VITALS — BP 132/86 | Ht 67.0 in | Wt 307.2 lb

## 2016-01-26 DIAGNOSIS — G4700 Insomnia, unspecified: Secondary | ICD-10-CM

## 2016-01-26 DIAGNOSIS — F329 Major depressive disorder, single episode, unspecified: Secondary | ICD-10-CM | POA: Diagnosis not present

## 2016-01-26 DIAGNOSIS — I878 Other specified disorders of veins: Secondary | ICD-10-CM

## 2016-01-26 DIAGNOSIS — I10 Essential (primary) hypertension: Secondary | ICD-10-CM

## 2016-01-26 DIAGNOSIS — M797 Fibromyalgia: Secondary | ICD-10-CM | POA: Diagnosis not present

## 2016-01-26 DIAGNOSIS — F32A Depression, unspecified: Secondary | ICD-10-CM

## 2016-01-26 MED ORDER — BUPROPION HCL ER (XL) 300 MG PO TB24: 300.0000 mg | ORAL_TABLET | Freq: Every day | ORAL | Status: DC

## 2016-01-26 MED ORDER — ZOLPIDEM TARTRATE 10 MG PO TABS: 10.0000 mg | ORAL_TABLET | Freq: Every evening | ORAL | Status: DC | PRN

## 2016-01-26 MED ORDER — BUPROPION HCL ER (XL) 150 MG PO TB24: 150.0000 mg | ORAL_TABLET | Freq: Every day | ORAL | Status: DC

## 2016-01-26 NOTE — Progress Notes (Signed)
   Subjective:    Patient ID: Christie Morrow, female    DOB: 1981-04-19, 35 y.o.   MRN: [id number] Patient arrives office for follow-up of multiple concerns HPI Patient arrives for a follow up on depression-patient feels like the Wellbutrin is not working anymore. Claims compliance with her Wellbutrin. No obvious side effects.  Trouble walking and exercise due to no enrgy and also the fivromyalgia has been acting up considerably.    Notes ongoing worsening difficulties with trouble sleeping. 5 mg Ambien no longer help and feels like she needs more.  Compliant with the Cymbalta. States overall seems to be helping the fibromyalgia.  Still taking the Lasix and potassium. This is both for hypertension and peripheral edema. Handling medicine well. No obvious side effects.   Review of Systems No headache, no major weight loss or weight gain, no chest pain no back pain abdominal pain no change in bowel habits complete ROS otherwise negative     Objective:   Physical Exam  Alert substantial obesity present vitals stable HEENT normal. Lungs clear. Heart regular rhythm. Ankles trace edema substantially obesity present      Assessment & Plan:  Impression 1 depression ongoing need to increase meds thoughts#152fiberMaugerfaircontroloncurrentregimen#3insomniaongoingdiscussed#4venousstasisdiscussed#5hypertensioncontroloncurrentmedsplanmedicationsrefilled.IncreaseWellbutrinthe450XL.IncreaseAmbienthe10dietexercisediscussedfollow-upasscheduledWSL

## 2016-03-05 ENCOUNTER — Telehealth: Payer: Self-pay | Admitting: Family Medicine

## 2016-03-05 MED ORDER — BUPROPION HCL ER (XL) 150 MG PO TB24: 150.0000 mg | ORAL_TABLET | Freq: Every day | ORAL | Status: DC

## 2016-03-05 MED ORDER — ALPRAZOLAM 1 MG PO TABS: ORAL_TABLET | ORAL | Status: DC

## 2016-03-05 MED ORDER — CYCLOBENZAPRINE HCL 10 MG PO TABS: 10.0000 mg | ORAL_TABLET | Freq: Three times a day (TID) | ORAL | Status: DC | PRN

## 2016-03-05 MED ORDER — BUPROPION HCL ER (XL) 300 MG PO TB24: 300.0000 mg | ORAL_TABLET | Freq: Every day | ORAL | Status: DC

## 2016-03-05 NOTE — Telephone Encounter (Signed)
Seen one mo ago, pt states will cont tome here reg for sched f u visits, due to be seen again in five mo, give five mo all medfs, make sure local refills cancelled, pt needs to stick with one pharm on these meds

## 2016-03-05 NOTE — Telephone Encounter (Signed)
Pt is needing refills on her cyclobenzaprine (FLEXERIL) 10 MG tablet, ALPRAZolam (XANAX) 1 MG tablet, buPROPion (WELLBUTRIN XL) 300 MG 24 hr tablet, and buPROPion (WELLBUTRIN XL) 150 MG 24 hr tablet sent into the pharmacy in [place].     [facility] PHONE # [phone]

## 2016-03-05 NOTE — Telephone Encounter (Signed)
Spoke with patient and informed her per Dr.Steve Luking- refills on requested medications were sent into pharmacy. Patient verbalized understanding.

## 2016-04-13 ENCOUNTER — Telehealth: Payer: Self-pay | Admitting: Family Medicine

## 2016-04-13 MED ORDER — DULOXETINE HCL 60 MG PO CPEP: 60.0000 mg | ORAL_CAPSULE | Freq: Every day | ORAL | Status: DC

## 2016-04-13 MED ORDER — FUROSEMIDE 40 MG PO TABS: 40.0000 mg | ORAL_TABLET | Freq: Every day | ORAL | Status: DC

## 2016-04-13 MED ORDER — POTASSIUM CHLORIDE CRYS ER 20 MEQ PO TBCR: 20.0000 meq | EXTENDED_RELEASE_TABLET | Freq: Every day | ORAL | Status: DC

## 2016-04-13 NOTE — Telephone Encounter (Signed)
Pt wants to have all of [facility] [place]. Pt is currently out of her potassium.       [facility] PHONE # [phone]

## 2016-04-13 NOTE — Telephone Encounter (Signed)
Patient was notified meds sent to pharmacy.

## 2016-05-11 ENCOUNTER — Other Ambulatory Visit: Payer: Self-pay | Admitting: *Deleted

## 2016-05-12 ENCOUNTER — Telehealth: Payer: Self-pay | Admitting: Family Medicine

## 2016-05-12 NOTE — Telephone Encounter (Signed)
ERROR

## 2016-05-13 NOTE — Progress Notes (Signed)
May have this and 2 refills causing drowsiness

## 2016-05-13 NOTE — Progress Notes (Signed)
May have 2 refills caution drowsiness

## 2016-05-14 MED ORDER — CYCLOBENZAPRINE HCL 10 MG PO TABS: 10.0000 mg | ORAL_TABLET | Freq: Three times a day (TID) | ORAL | Status: DC | PRN

## 2016-06-10 ENCOUNTER — Other Ambulatory Visit: Payer: Self-pay | Admitting: Family Medicine

## 2016-06-21 ENCOUNTER — Telehealth: Payer: Self-pay | Admitting: Family Medicine

## 2016-06-21 MED ORDER — ZOLPIDEM TARTRATE 10 MG PO TABS: 10.0000 mg | ORAL_TABLET | Freq: Every evening | ORAL | 0 refills | Status: DC | PRN

## 2016-06-21 MED ORDER — ALPRAZOLAM 1 MG PO TABS: ORAL_TABLET | ORAL | 0 refills | Status: DC

## 2016-06-21 NOTE — Telephone Encounter (Signed)
Patient last seen 4/17 and has moved [place]

## 2016-06-21 NOTE — Telephone Encounter (Signed)
Pt called requesting a refill on her ALPRAZolam (XANAX) 1 MG tablet And zolpidem (AMBIEN) 10 MG tablet.    [facility] [place]

## 2016-06-21 NOTE — Telephone Encounter (Signed)
See may note telephone give one mo with one mo refill. Pt needs to keep six mo visit with us (remains to be seen) as she claimed OT f u down there

## 2016-06-21 NOTE — Telephone Encounter (Signed)
Prescriptions faxed to pharmacy.  Patient scheduled follow up office visit in November during thanksgiving break

## 2016-07-14 ENCOUNTER — Telehealth: Payer: Self-pay | Admitting: Family Medicine

## 2016-07-14 MED ORDER — ALPRAZOLAM 1 MG PO TABS: ORAL_TABLET | ORAL | 1 refills | Status: DC

## 2016-07-14 NOTE — Telephone Encounter (Signed)
Was seen in April so ok one rx xanax plus one monthly ref

## 2016-07-14 NOTE — Telephone Encounter (Signed)
Pt called stating that she has an appt scheduled for Nov when her daughter has a break from school. Pt is requesting refills on her xanax to last her till then. Informed pt that she may need an appt before then due to her being told in the past that she needed an appt for further refills.   [facility] [place]

## 2016-07-14 NOTE — Telephone Encounter (Signed)
Spoke with patient and informed her per Dr.Steve Luking- refills on requested medication was sent into pharmacy. Patient verbalized understanding.

## 2016-07-20 ENCOUNTER — Other Ambulatory Visit: Payer: Self-pay | Admitting: Family Medicine

## 2016-07-20 NOTE — Telephone Encounter (Signed)
One mo plus one ref 

## 2016-07-21 ENCOUNTER — Telehealth: Payer: Self-pay | Admitting: Family Medicine

## 2016-07-21 ENCOUNTER — Other Ambulatory Visit: Payer: Self-pay | Admitting: Family Medicine

## 2016-07-21 NOTE — Telephone Encounter (Signed)
error 

## 2016-07-30 ENCOUNTER — Ambulatory Visit: Payer: BLUE CROSS/BLUE SHIELD | Admitting: Family Medicine

## 2016-08-27 ENCOUNTER — Telehealth: Payer: Self-pay | Admitting: Family Medicine

## 2016-08-27 ENCOUNTER — Other Ambulatory Visit: Payer: Self-pay | Admitting: Family Medicine

## 2016-08-27 MED ORDER — ALPRAZOLAM 1 MG PO TABS: ORAL_TABLET | ORAL | 0 refills | Status: DC

## 2016-08-27 NOTE — Telephone Encounter (Signed)
Spoke with patient and informed her per Dr.Scott Luking- she may have one additional refill and office visit needed for additional. Patient verbalized understanding.

## 2016-08-27 NOTE — Telephone Encounter (Signed)
1 refill only she will need office visit before additional refills

## 2016-08-27 NOTE — Telephone Encounter (Signed)
Pt is requesting a refill on her ALPRAZolam (XANAX) 1 MG tablet. Pt is requesting that this be done by 4:30pm. She is going out of town.    [facility] [place]

## 2016-09-07 ENCOUNTER — Other Ambulatory Visit: Payer: Self-pay | Admitting: Family Medicine

## 2016-09-07 NOTE — Telephone Encounter (Signed)
30 d worth, no more til seen

## 2016-09-10 DIAGNOSIS — I10 Essential (primary) hypertension: Secondary | ICD-10-CM | POA: Diagnosis not present

## 2016-09-10 DIAGNOSIS — L03311 Cellulitis of abdominal wall: Secondary | ICD-10-CM | POA: Diagnosis not present

## 2016-09-10 DIAGNOSIS — N732 Unspecified parametritis and pelvic cellulitis: Secondary | ICD-10-CM | POA: Diagnosis not present

## 2016-09-15 ENCOUNTER — Ambulatory Visit (INDEPENDENT_AMBULATORY_CARE_PROVIDER_SITE_OTHER): Payer: BLUE CROSS/BLUE SHIELD | Admitting: Nurse Practitioner

## 2016-09-15 ENCOUNTER — Encounter: Payer: Self-pay | Admitting: Nurse Practitioner

## 2016-09-15 ENCOUNTER — Ambulatory Visit: Payer: BLUE CROSS/BLUE SHIELD | Admitting: Family Medicine

## 2016-09-15 VITALS — BP 120/90 | Ht 67.0 in | Wt 299.0 lb

## 2016-09-15 DIAGNOSIS — G4700 Insomnia, unspecified: Secondary | ICD-10-CM | POA: Diagnosis not present

## 2016-09-15 DIAGNOSIS — F32A Depression, unspecified: Secondary | ICD-10-CM

## 2016-09-15 DIAGNOSIS — F329 Major depressive disorder, single episode, unspecified: Secondary | ICD-10-CM | POA: Diagnosis not present

## 2016-09-15 DIAGNOSIS — M797 Fibromyalgia: Secondary | ICD-10-CM

## 2016-09-15 DIAGNOSIS — Z23 Encounter for immunization: Secondary | ICD-10-CM | POA: Diagnosis not present

## 2016-09-15 DIAGNOSIS — I10 Essential (primary) hypertension: Secondary | ICD-10-CM | POA: Diagnosis not present

## 2016-09-15 MED ORDER — ALPRAZOLAM 1 MG PO TABS: ORAL_TABLET | ORAL | 5 refills | Status: DC

## 2016-09-15 MED ORDER — ZOLPIDEM TARTRATE 10 MG PO TABS: 10.0000 mg | ORAL_TABLET | Freq: Every evening | ORAL | 5 refills | Status: DC | PRN

## 2016-09-15 MED ORDER — CLINDAMYCIN HCL 300 MG PO CAPS: 300.0000 mg | ORAL_CAPSULE | Freq: Three times a day (TID) | ORAL | 0 refills | Status: DC

## 2016-09-17 ENCOUNTER — Encounter: Payer: Self-pay | Admitting: Nurse Practitioner

## 2016-09-17 NOTE — Progress Notes (Signed)
Subjective:  Presents for routine follow-up. No chest pain/ischemic type pain or shortness of breath. Taking daily Lasix and potassium. No edema. Did not get lab work done from last year. [place], comes here for her routine care, has family in the area. Ambien works well for her sleep issues. Takes occasional Xanax for anxiety. Still having some issues with depression. States there some days that she just stays at home and does not even take a bath. Also continues to struggle with fibromyalgia. Questions whether a new medication may help more than the Cymbalta. After visit patient asked me to come back and the brain to look at an abscess on her left lower abdomen in the intertriginous area. Was seen at urgent care where she lives. No culture. Started on Bactrim. This was several days ago. Patient lanced the area herself. Has seen minimal improvement. No fever.  Objective:   BP 120/90   Ht 5\' 7"  (1.702 m)   Wt 299 lb (135.6 kg)   BMI 46.83 kg/m  NAD. Alert, oriented. Lungs clear. Heart regular rate rhythm. Lower extremities no edema. Erythematous slightly raised area noted in the left intertriginous area with a small linear opening noted. Small amount serosanguineous drainage noted on the dressing.  Assessment:  Problem List Items Addressed This Visit      Cardiovascular and Mediastinum   Essential hypertension - Primary   Relevant Orders   Comprehensive metabolic panel   Hepatic function panel   Lipid panel     Other   Depression   Relevant Medications   ALPRAZolam (XANAX) 1 MG tablet   Other Relevant Orders   TSH   Fibromyalgia   Relevant Orders   Vitamin D (25 hydroxy)   Insomnia   Relevant Orders   TSH    Other Visit Diagnoses    Encounter for immunization       Relevant Orders   Flu Vaccine QUAD 36+ mos IM (Completed)     Plan:  Meds ordered this encounter  Medications  . cephALEXin (KEFLEX) 250 MG capsule    Sig: Take by mouth 4 (four) times  daily.  Marland Kitchen. sulfamethoxazole-trimethoprim (BACTRIM DS,SEPTRA DS) 800-160 MG tablet    Sig: Take 1 tablet by mouth 2 (two) times daily.  Marland Kitchen. zolpidem (AMBIEN) 10 MG tablet    Sig: Take 1 tablet (10 mg total) by mouth at bedtime as needed. for sleep    Dispense:  30 tablet    Refill:  5    Order Specific Question:   Supervising Provider    Answer:   Merlyn AlbertLUKING, WILLIAM S [[id number]]  . ALPRAZolam (XANAX) 1 MG tablet    Sig: Take one half tablet bid prn. Caution drowiness    Dispense:  30 tablet    Refill:  5    May refill monthly    Order Specific Question:   Supervising Provider    Answer:   Merlyn AlbertLUKING, WILLIAM S [[id number]]  . clindamycin (CLEOCIN) 300 MG capsule    Sig: Take 1 capsule (300 mg total) by mouth 3 (three) times daily.    Dispense:  30 capsule    Refill:  0    Order Specific Question:   Supervising Provider    Answer:   Merlyn AlbertLUKING, WILLIAM S [[id number]]   Stop Bactrim. Switched to clindamycin. Patient to be rechecked in [place] next week if no improvement, sooner if worse. Continue current medications as directed. Will check into new medicines to see if this may be a better  choice. Patient cautioned about potential cost. Strongly encouraged patient to start counseling at mental health [place]. Return in about 6 months (around 03/15/2017) for recheck.

## 2016-09-27 ENCOUNTER — Telehealth: Payer: Self-pay | Admitting: Family Medicine

## 2016-09-27 ENCOUNTER — Encounter: Payer: Self-pay | Admitting: Nurse Practitioner

## 2016-09-27 NOTE — Telephone Encounter (Signed)
Message for Christie JonesCarolyn- patient states you were going to do research on a medication for her called mycin. She couldn't remember name but she states you talked about it in her last visit.

## 2016-09-27 NOTE — Telephone Encounter (Signed)
I did look it up within a day of her visit but could not find medicine. Can she check on name and let me know? I think it was for fibromyalgia. Nothing I could find fit her description.

## 2016-09-27 NOTE — Telephone Encounter (Signed)
Patient states it is Nucynta 100 mg.

## 2016-09-28 ENCOUNTER — Telehealth: Payer: Self-pay | Admitting: Rheumatology

## 2016-09-28 NOTE — Telephone Encounter (Signed)
Patient has questions about Tramadol rx. Please call patient.

## 2016-09-28 NOTE — Telephone Encounter (Signed)
Patient states she was given a prescription for Tramadol by Dr. Corliss Skainseveshwar a few years ago and wanted to know the dosage and the amount given. Patient states her PCP is going to prescribed the medication for her and wanted to know the dosage she was given and the amount. Provided patient with the information requested.

## 2016-09-28 NOTE — Telephone Encounter (Signed)
Sent mychart message

## 2016-09-29 ENCOUNTER — Encounter: Payer: Self-pay | Admitting: Nurse Practitioner

## 2016-09-29 NOTE — Telephone Encounter (Signed)
Patient would like you to fax the quantity and mg of the tramadol that we prescribed faxed to [phone], attn: Electa Sniffarolyn; [facility]. Any questions pt cb# [phone]. [place] now and didn't want to make a rov appointment

## 2016-09-30 ENCOUNTER — Encounter: Payer: Self-pay | Admitting: Nurse Practitioner

## 2016-09-30 ENCOUNTER — Other Ambulatory Visit: Payer: Self-pay | Admitting: Nurse Practitioner

## 2016-09-30 MED ORDER — TRAMADOL HCL 50 MG PO TABS: 50.0000 mg | ORAL_TABLET | Freq: Two times a day (BID) | ORAL | 2 refills | Status: DC

## 2016-10-06 ENCOUNTER — Encounter: Payer: Self-pay | Admitting: Nurse Practitioner

## 2016-10-06 ENCOUNTER — Other Ambulatory Visit: Payer: Self-pay | Admitting: Family Medicine

## 2016-10-06 NOTE — Telephone Encounter (Signed)
Dr steves patient! 

## 2016-10-06 NOTE — Telephone Encounter (Signed)
Attempted to contact the patient and left message for patient to call the office. Patient returned call to office and states she no longer needs to information sent.

## 2016-10-07 ENCOUNTER — Other Ambulatory Visit: Payer: Self-pay | Admitting: Nurse Practitioner

## 2016-10-07 ENCOUNTER — Other Ambulatory Visit: Payer: Self-pay | Admitting: Family Medicine

## 2016-10-07 MED ORDER — BUPROPION HCL ER (XL) 150 MG PO TB24: 150.0000 mg | ORAL_TABLET | Freq: Every day | ORAL | 5 refills | Status: DC

## 2016-10-07 MED ORDER — BUPROPION HCL ER (XL) 300 MG PO TB24: 300.0000 mg | ORAL_TABLET | Freq: Every day | ORAL | 5 refills | Status: DC

## 2016-10-07 NOTE — Telephone Encounter (Signed)
Look at carolyn's nov visit , nurses plz let me know why we apparently did five ref, and now there are none, this seems to happen more frequently now??

## 2016-10-07 NOTE — Telephone Encounter (Signed)
Routing to Dr Brett CanalesSteve.

## 2016-10-07 NOTE — Telephone Encounter (Signed)
Pt called to check status of refill request  Pt states she didn't get a hand signed Rx at her last OV for her alprazolam  Leaving to go out of town very soon (within the hour)    Please advise      [facility] [facility] [address] (if it can be faxed)

## 2016-10-08 ENCOUNTER — Other Ambulatory Visit: Payer: Self-pay | Admitting: *Deleted

## 2016-10-08 ENCOUNTER — Telehealth: Payer: Self-pay | Admitting: *Deleted

## 2016-10-08 MED ORDER — ALPRAZOLAM 1 MG PO TABS: ORAL_TABLET | ORAL | 2 refills | Status: DC

## 2016-10-08 MED ORDER — POTASSIUM CHLORIDE CRYS ER 20 MEQ PO TBCR: 20.0000 meq | EXTENDED_RELEASE_TABLET | Freq: Every day | ORAL | 2 refills | Status: DC

## 2016-10-08 MED ORDER — ZOLPIDEM TARTRATE 10 MG PO TABS: 10.0000 mg | ORAL_TABLET | Freq: Every evening | ORAL | 2 refills | Status: DC | PRN

## 2016-10-08 NOTE — Telephone Encounter (Signed)
This has been addressed. See my chart message

## 2016-10-08 NOTE — Telephone Encounter (Signed)
Called pharm and they did not receive rx. Discussed with carolyn. Ok to give 3 refills. Refills called in instead of faxing. Sometimes the faxes are dark and the pharm cannot read them. This may be why they did not receive the first time

## 2016-10-08 NOTE — Telephone Encounter (Signed)
Pt calling to check on her refills again. She states her grandfather passed and she needs her meds. She cannot go out of town until she gets refills. Wanted to leave yesterday.

## 2016-10-14 ENCOUNTER — Encounter: Payer: Self-pay | Admitting: Nurse Practitioner

## 2016-10-15 ENCOUNTER — Other Ambulatory Visit: Payer: Self-pay | Admitting: Nurse Practitioner

## 2016-10-15 ENCOUNTER — Encounter: Payer: Self-pay | Admitting: Nurse Practitioner

## 2016-10-15 ENCOUNTER — Other Ambulatory Visit: Payer: Self-pay | Admitting: Family Medicine

## 2016-10-15 MED ORDER — BUPROPION HCL ER (XL) 300 MG PO TB24: 300.0000 mg | ORAL_TABLET | Freq: Every day | ORAL | 5 refills | Status: DC

## 2016-10-15 MED ORDER — CYCLOBENZAPRINE HCL 10 MG PO TABS
10.0000 mg | ORAL_TABLET | Freq: Three times a day (TID) | ORAL | 2 refills | Status: DC | PRN
Start: 2016-10-15 — End: 2017-06-24

## 2016-10-15 MED ORDER — DULOXETINE HCL 60 MG PO CPEP: 60.0000 mg | ORAL_CAPSULE | Freq: Every day | ORAL | 5 refills | Status: DC

## 2016-10-15 MED ORDER — FUROSEMIDE 40 MG PO TABS: 40.0000 mg | ORAL_TABLET | Freq: Every day | ORAL | 5 refills | Status: DC

## 2016-10-15 MED ORDER — BUPROPION HCL ER (XL) 150 MG PO TB24: 150.0000 mg | ORAL_TABLET | Freq: Every day | ORAL | 5 refills | Status: DC

## 2016-10-15 MED ORDER — POTASSIUM CHLORIDE CRYS ER 20 MEQ PO TBCR: 20.0000 meq | EXTENDED_RELEASE_TABLET | Freq: Every day | ORAL | 5 refills | Status: DC

## 2016-10-24 ENCOUNTER — Encounter: Payer: Self-pay | Admitting: Nurse Practitioner

## 2016-10-28 ENCOUNTER — Encounter: Payer: Self-pay | Admitting: Nurse Practitioner

## 2016-10-28 ENCOUNTER — Telehealth: Payer: Self-pay | Admitting: Family Medicine

## 2016-10-28 NOTE — Telephone Encounter (Signed)
Patient is requesting that you cancel future xanax prescriptions and wanting tramadol 50 mg filled.Photographer( Message for Salmonarolyn)

## 2016-10-28 NOTE — Telephone Encounter (Signed)
Please advise 

## 2016-10-29 ENCOUNTER — Other Ambulatory Visit: Payer: Self-pay | Admitting: Nurse Practitioner

## 2016-10-29 MED ORDER — TRAMADOL HCL 50 MG PO TABS: 50.0000 mg | ORAL_TABLET | Freq: Two times a day (BID) | ORAL | 2 refills | Status: DC

## 2016-10-29 NOTE — Telephone Encounter (Signed)
Med faxed to pharm and called pharm to verify they received fax. [facility] [place] [facility]. Pt notified.

## 2016-10-29 NOTE — Telephone Encounter (Signed)
Patient called to check on this message.

## 2016-10-29 NOTE — Progress Notes (Signed)
done

## 2016-10-29 NOTE — Telephone Encounter (Signed)
Already addressed and my chart message sent.

## 2016-10-29 NOTE — Telephone Encounter (Signed)
[facility] says that [facility] [place] [facility] not have Rx Tramadol.  Can this be resent?

## 2016-12-17 ENCOUNTER — Ambulatory Visit: Payer: BLUE CROSS/BLUE SHIELD | Admitting: Nurse Practitioner

## 2016-12-21 ENCOUNTER — Encounter: Payer: Self-pay | Admitting: Nurse Practitioner

## 2016-12-22 ENCOUNTER — Telehealth: Payer: Self-pay | Admitting: Nurse Practitioner

## 2016-12-22 ENCOUNTER — Encounter: Payer: Self-pay | Admitting: Nurse Practitioner

## 2016-12-22 MED ORDER — TRAMADOL HCL 50 MG PO TABS: 50.0000 mg | ORAL_TABLET | Freq: Two times a day (BID) | ORAL | 0 refills | Status: DC

## 2016-12-22 NOTE — Telephone Encounter (Signed)
Script printed and placed on your desk for signature.

## 2016-12-22 NOTE — Telephone Encounter (Signed)
Nurses please send in [facility] and cancel any other refills. Patient has moved here and has one refill left; want to [facility] all [facility] to this [facility]. Thanks.

## 2016-12-24 ENCOUNTER — Ambulatory Visit: Payer: BLUE CROSS/BLUE SHIELD | Admitting: Nurse Practitioner

## 2016-12-29 ENCOUNTER — Encounter: Payer: Self-pay | Admitting: Family Medicine

## 2016-12-31 ENCOUNTER — Encounter: Payer: Self-pay | Admitting: Nurse Practitioner

## 2016-12-31 ENCOUNTER — Ambulatory Visit (INDEPENDENT_AMBULATORY_CARE_PROVIDER_SITE_OTHER): Payer: BLUE CROSS/BLUE SHIELD | Admitting: Nurse Practitioner

## 2016-12-31 VITALS — BP 118/74 | Ht 67.0 in | Wt 288.0 lb

## 2016-12-31 DIAGNOSIS — M25562 Pain in left knee: Secondary | ICD-10-CM | POA: Diagnosis not present

## 2016-12-31 DIAGNOSIS — K219 Gastro-esophageal reflux disease without esophagitis: Secondary | ICD-10-CM | POA: Diagnosis not present

## 2016-12-31 DIAGNOSIS — G4700 Insomnia, unspecified: Secondary | ICD-10-CM

## 2016-12-31 DIAGNOSIS — M25561 Pain in right knee: Secondary | ICD-10-CM | POA: Diagnosis not present

## 2016-12-31 DIAGNOSIS — F418 Other specified anxiety disorders: Secondary | ICD-10-CM | POA: Diagnosis not present

## 2016-12-31 DIAGNOSIS — G8929 Other chronic pain: Secondary | ICD-10-CM

## 2016-12-31 MED ORDER — FUROSEMIDE 40 MG PO TABS: 40.0000 mg | ORAL_TABLET | Freq: Every day | ORAL | 5 refills | Status: DC

## 2016-12-31 MED ORDER — TRAMADOL HCL 50 MG PO TABS: 50.0000 mg | ORAL_TABLET | Freq: Two times a day (BID) | ORAL | 2 refills | Status: DC

## 2016-12-31 MED ORDER — BUPROPION HCL ER (XL) 300 MG PO TB24: 300.0000 mg | ORAL_TABLET | Freq: Every day | ORAL | 5 refills | Status: DC

## 2016-12-31 MED ORDER — ALPRAZOLAM 1 MG PO TABS: ORAL_TABLET | ORAL | 2 refills | Status: DC

## 2016-12-31 MED ORDER — BUPROPION HCL ER (XL) 150 MG PO TB24: 150.0000 mg | ORAL_TABLET | Freq: Every day | ORAL | 5 refills | Status: DC

## 2016-12-31 MED ORDER — DULOXETINE HCL 60 MG PO CPEP: 60.0000 mg | ORAL_CAPSULE | Freq: Every day | ORAL | 5 refills | Status: DC

## 2016-12-31 MED ORDER — ZOLPIDEM TARTRATE 10 MG PO TABS: 10.0000 mg | ORAL_TABLET | Freq: Every evening | ORAL | 2 refills | Status: DC | PRN

## 2016-12-31 MED ORDER — POTASSIUM CHLORIDE CRYS ER 20 MEQ PO TBCR: 20.0000 meq | EXTENDED_RELEASE_TABLET | Freq: Every day | ORAL | 5 refills | Status: DC

## 2016-12-31 NOTE — Progress Notes (Signed)
Subjective:  Presents today for routing medication follow up.  Was started on Tramadol 50mg  BID for knee pain with good compliance and tolerance. Is not causing drowsiness.  Has stopped Flexeril.  Has increased walking and had expected 10lb weight loss.  Xanax was stopped at previous visit to avoid medication interaction, but has had increased stress/anxiety in personal life and would like to re-start.  Experiencing some sharp chest pain intermittently in the afternoons when has increased anxiety. Resolves on own. Denies palpitations or leg swelling/pain.  Denies SOB, wheezing, or orthopnea. Is also experiencing reflux, denies abd pain, nausea, vomiting, diarrhea or constipation.  Has a high caffeine intake. Sleeping well with Ambien as needed at bedtime.    Objective:   BP 118/74   Ht 5\' 7"  (1.702 m)   Wt 288 lb (130.6 kg)   BMI 45.11 kg/m  Alert and Oriented, NAD, well nourished, calm affect.  Chest: Lungs CTA, Cardiac: RRR, no murmurs Abd: soft, non-tender   Assessment:   Problem List Items Addressed This Visit      Digestive   Gastroesophageal reflux disease without esophagitis     Other   Chronic pain of both knees - Primary   Relevant Medications   buPROPion (WELLBUTRIN XL) 150 MG 24 hr tablet   buPROPion (WELLBUTRIN XL) 300 MG 24 hr tablet   DULoxetine (CYMBALTA) 60 MG capsule   traMADol (ULTRAM) 50 MG tablet   Depression with anxiety   Insomnia        Plan:   Meds ordered this encounter  Medications  . buPROPion (WELLBUTRIN XL) 150 MG 24 hr tablet    Sig: Take 1 tablet (150 mg total) by mouth daily.    Dispense:  30 tablet    Refill:  5    Order Specific Question:   Supervising Provider    Answer:   Merlyn Albert [[id number]]  . buPROPion (WELLBUTRIN XL) 300 MG 24 hr tablet    Sig: Take 1 tablet (300 mg total) by mouth daily.    Dispense:  30 tablet    Refill:  5    Order Specific Question:   Supervising Provider    Answer:   Merlyn Albert [[id number]]  . DULoxetine  (CYMBALTA) 60 MG capsule    Sig: Take 1 capsule (60 mg total) by mouth daily.    Dispense:  30 capsule    Refill:  5    Order Specific Question:   Supervising Provider    Answer:   Merlyn Albert [[id number]]  . furosemide (LASIX) 40 MG tablet    Sig: Take 1 tablet (40 mg total) by mouth daily.    Dispense:  30 tablet    Refill:  5    Order Specific Question:   Supervising Provider    Answer:   Merlyn Albert [[id number]]  . potassium chloride SA (K-DUR,KLOR-CON) 20 MEQ tablet    Sig: Take 1 tablet (20 mEq total) by mouth daily.    Dispense:  30 tablet    Refill:  5    Order Specific Question:   Supervising Provider    Answer:   Merlyn Albert [[id number]]  . zolpidem (AMBIEN) 10 MG tablet    Sig: Take 1 tablet (10 mg total) by mouth at bedtime as needed. for sleep    Dispense:  30 tablet    Refill:  2    Cancel other prescription from [employer] Specific Question:   Supervising Provider  Answer:   Merlyn AlbertLUKING, WILLIAM S [[id number]]  . traMADol (ULTRAM) 50 MG tablet    Sig: Take 1 tablet (50 mg total) by mouth 2 (two) times daily. Prn pain    Dispense:  60 tablet    Refill:  2    [facility] other [facility] from [facility]    Order Specific Question:   Supervising Provider    Answer:   Merlyn AlbertLUKING, WILLIAM S [[id number]]  . ALPRAZolam (XANAX) 1 MG tablet    Sig: Take 1/2 tab po BID prn anxiety    Dispense:  30 tablet    Refill:  2    [facility] other [facility] from [facility]    Order Specific Question:   Supervising Provider    Answer:   Merlyn AlbertLUKING, WILLIAM S [[id number]]   Start Prilosec daily for 2 weeks, then continue as needed.  OTC Tums/maalox for immediate relief.  Notify office if symptoms persist or worsen. Encouraged decrease caffeine  Continue Tramadol for pain. Discontinue Flexeril.  Continue Ambien as needed for sleep.    Re-start Xanax 0.5mg  BID for anxiety. Advised not to take Tramadol, Xanax and Ambien at the same time due to potential drowsiness.  Warning signs reviewed.     Return in about 3  months (around 04/02/2017) for recheck.

## 2017-01-10 DIAGNOSIS — Z01419 Encounter for gynecological examination (general) (routine) without abnormal findings: Secondary | ICD-10-CM | POA: Diagnosis not present

## 2017-01-10 DIAGNOSIS — Z113 Encounter for screening for infections with a predominantly sexual mode of transmission: Secondary | ICD-10-CM | POA: Diagnosis not present

## 2017-01-10 DIAGNOSIS — Z114 Encounter for screening for human immunodeficiency virus [HIV]: Secondary | ICD-10-CM | POA: Diagnosis not present

## 2017-01-10 DIAGNOSIS — Z6841 Body Mass Index (BMI) 40.0 and over, adult: Secondary | ICD-10-CM | POA: Diagnosis not present

## 2017-01-10 DIAGNOSIS — Z1151 Encounter for screening for human papillomavirus (HPV): Secondary | ICD-10-CM | POA: Diagnosis not present

## 2017-02-08 DIAGNOSIS — N924 Excessive bleeding in the premenopausal period: Secondary | ICD-10-CM | POA: Diagnosis not present

## 2017-02-08 DIAGNOSIS — Z30431 Encounter for routine checking of intrauterine contraceptive device: Secondary | ICD-10-CM | POA: Diagnosis not present

## 2017-02-08 DIAGNOSIS — N921 Excessive and frequent menstruation with irregular cycle: Secondary | ICD-10-CM | POA: Diagnosis not present

## 2017-02-08 DIAGNOSIS — Z975 Presence of (intrauterine) contraceptive device: Secondary | ICD-10-CM | POA: Diagnosis not present

## 2017-02-08 DIAGNOSIS — Z6841 Body Mass Index (BMI) 40.0 and over, adult: Secondary | ICD-10-CM | POA: Diagnosis not present

## 2017-03-03 ENCOUNTER — Encounter: Payer: Self-pay | Admitting: Family Medicine

## 2017-03-16 ENCOUNTER — Encounter: Payer: Self-pay | Admitting: Nurse Practitioner

## 2017-03-17 ENCOUNTER — Other Ambulatory Visit: Payer: Self-pay | Admitting: Nurse Practitioner

## 2017-03-17 MED ORDER — ALPRAZOLAM 1 MG PO TABS: ORAL_TABLET | ORAL | 2 refills | Status: DC

## 2017-03-18 ENCOUNTER — Telehealth: Payer: Self-pay | Admitting: Nurse Practitioner

## 2017-03-18 NOTE — Telephone Encounter (Signed)
RX already printed and signed. Ready for fax. Thanks.

## 2017-03-18 NOTE — Telephone Encounter (Signed)
Prescription faxed to pharmacy. Patient notified. 

## 2017-03-18 NOTE — Telephone Encounter (Signed)
Patient is requesting the Rx for her Xanax to be faxed to [facility] ASAP today because she is going out of town.  She said Eber JonesCarolyn told her she would write the Rx today.

## 2017-03-30 ENCOUNTER — Other Ambulatory Visit: Payer: Self-pay | Admitting: Family Medicine

## 2017-04-01 ENCOUNTER — Encounter: Payer: Self-pay | Admitting: Nurse Practitioner

## 2017-04-01 ENCOUNTER — Ambulatory Visit (INDEPENDENT_AMBULATORY_CARE_PROVIDER_SITE_OTHER): Payer: BLUE CROSS/BLUE SHIELD | Admitting: Nurse Practitioner

## 2017-04-01 VITALS — BP 134/98 | Ht 67.0 in | Wt 278.0 lb

## 2017-04-01 DIAGNOSIS — I10 Essential (primary) hypertension: Secondary | ICD-10-CM | POA: Diagnosis not present

## 2017-04-01 DIAGNOSIS — M25561 Pain in right knee: Secondary | ICD-10-CM | POA: Diagnosis not present

## 2017-04-01 DIAGNOSIS — Z13220 Encounter for screening for lipoid disorders: Secondary | ICD-10-CM | POA: Diagnosis not present

## 2017-04-01 DIAGNOSIS — G4700 Insomnia, unspecified: Secondary | ICD-10-CM | POA: Diagnosis not present

## 2017-04-01 DIAGNOSIS — R5383 Other fatigue: Secondary | ICD-10-CM | POA: Diagnosis not present

## 2017-04-01 DIAGNOSIS — M25562 Pain in left knee: Secondary | ICD-10-CM

## 2017-04-01 DIAGNOSIS — G8929 Other chronic pain: Secondary | ICD-10-CM

## 2017-04-01 DIAGNOSIS — M797 Fibromyalgia: Secondary | ICD-10-CM

## 2017-04-01 DIAGNOSIS — F418 Other specified anxiety disorders: Secondary | ICD-10-CM

## 2017-04-01 DIAGNOSIS — K219 Gastro-esophageal reflux disease without esophagitis: Secondary | ICD-10-CM | POA: Diagnosis not present

## 2017-04-01 MED ORDER — TRAMADOL HCL 50 MG PO TABS: 50.0000 mg | ORAL_TABLET | Freq: Two times a day (BID) | ORAL | 2 refills | Status: DC

## 2017-04-01 MED ORDER — OMEPRAZOLE 40 MG PO CPDR: 40.0000 mg | DELAYED_RELEASE_CAPSULE | Freq: Every day | ORAL | 3 refills | Status: DC

## 2017-04-02 LAB — VITAMIN D 25 HYDROXY (VIT D DEFICIENCY, FRACTURES): VIT D 25 HYDROXY: 23.8 ng/mL — AB (ref 30.0–100.0)

## 2017-04-02 LAB — LIPID PANEL
CHOL/HDL RATIO: 5.9 ratio — AB (ref 0.0–4.4)
Cholesterol, Total: 201 mg/dL — ABNORMAL HIGH (ref 100–199)
HDL: 34 mg/dL — AB (ref >39)
LDL Calculated: 138 mg/dL — ABNORMAL HIGH (ref 0–99)
TRIGLYCERIDES: 143 mg/dL (ref 0–149)
VLDL CHOLESTEROL CAL: 29 mg/dL (ref 5–40)

## 2017-04-02 LAB — TSH: TSH: 1.44 u[IU]/mL (ref 0.450–4.500)

## 2017-04-02 LAB — COMPREHENSIVE METABOLIC PANEL
A/G RATIO: 1.6 (ref 1.2–2.2)
ALBUMIN: 5 g/dL (ref 3.5–5.5)
ALK PHOS: 106 IU/L (ref 39–117)
ALT: 38 IU/L — AB (ref 0–32)
AST: 24 IU/L (ref 0–40)
BILIRUBIN TOTAL: 0.4 mg/dL (ref 0.0–1.2)
BUN / CREAT RATIO: 10 (ref 9–23)
BUN: 11 mg/dL (ref 6–20)
CHLORIDE: 99 mmol/L (ref 96–106)
CO2: 23 mmol/L (ref 18–29)
Calcium: 9.6 mg/dL (ref 8.7–10.2)
Creatinine, Ser: 1.07 mg/dL — ABNORMAL HIGH (ref 0.57–1.00)
GFR calc Af Amer: 79 mL/min/{1.73_m2} (ref >59)
GFR calc non Af Amer: 68 mL/min/{1.73_m2} (ref >59)
Globulin, Total: 3.1 g/dL (ref 1.5–4.5)
Glucose: 87 mg/dL (ref 65–99)
POTASSIUM: 4.1 mmol/L (ref 3.5–5.2)
Sodium: 138 mmol/L (ref 134–144)
Total Protein: 8.1 g/dL (ref 6.0–8.5)

## 2017-04-03 ENCOUNTER — Encounter: Payer: Self-pay | Admitting: Nurse Practitioner

## 2017-04-03 NOTE — Progress Notes (Addendum)
Subjective:  Presents for recheck on depression and anxiety. Struggling at this time. Remains separated from her husband. Trying to decide about reconciling and moving back [place]. Denies suicidal or homicidal thoughts or ideation. Some fatigue. Sleeping well with Ambien. Does not take that with Xanax or Tramadol. Taking Lasix about every day. Does not always take potassium. Controls peripheral edema. Reflux stable on Omeprazole.   Objective:   BP (!) 134/98   Ht 5\' 7"  (1.702 m)   Wt 278 lb (126.1 kg)   BMI 43.54 kg/m  NAD. Alert, oriented. Lungs clear. Heart RRR. Thoughts logical coherent and relevant. Calm affect. Dressed appropriately. Making good eye contact. LE: no edema. Abdomen soft, non distended, non tender.    Assessment:   Problem List Items Addressed This Visit      Cardiovascular and Mediastinum   Essential hypertension - Primary   Relevant Orders   Comprehensive Metabolic Panel (CMET) (Completed)     Digestive   Gastroesophageal reflux disease without esophagitis   Relevant Medications   omeprazole (PRILOSEC) 40 MG capsule     Other   Chronic pain of both knees   Relevant Medications   traMADol (ULTRAM) 50 MG tablet   Depression with anxiety   Fibromyalgia   Insomnia    Other Visit Diagnoses    Other fatigue       Relevant Orders   TSH (Completed)   Lipid panel (Completed)   Comprehensive Metabolic Panel (CMET) (Completed)   VITAMIN D 25 Hydroxy (Vit-D Deficiency, Fractures) (Completed)   Screening for lipid disorders       Relevant Orders   TSH (Completed)         Plan:   Meds ordered this encounter  Medications  . traMADol (ULTRAM) 50 MG tablet    Sig: Take 1 tablet (50 mg total) by mouth 2 (two) times daily. Prn pain    Dispense:  60 tablet    Refill:  2    [facility] from [facility]    Order Specific Question:   Supervising Provider    Answer:   Merlyn AlbertLUKING, WILLIAM S [[id number]]  . omeprazole (PRILOSEC) 40 MG capsule    Sig: Take 1  capsule (40 mg total) by mouth daily.    Dispense:  30 capsule    Refill:  3    Order Specific Question:   Supervising Provider    Answer:   Merlyn AlbertLUKING, WILLIAM S [[id number]]    strongly encouraged patient to start mental health counseling. She agrees once she decides where to live. Call back if she needs assistance. Patient understands not to take Xanax, Ambien and Tramadol at the same time. Over 1/2 the visit was spent in consultation and discussion. Patient to take a potassium each time she takes Lasix to avoid hypokalemia. Verbalizes understanding.  Return in about 4 months (around 08/01/2017).

## 2017-04-13 ENCOUNTER — Encounter: Payer: Self-pay | Admitting: Nurse Practitioner

## 2017-04-13 ENCOUNTER — Other Ambulatory Visit: Payer: Self-pay | Admitting: Nurse Practitioner

## 2017-04-13 MED ORDER — VITAMIN D (ERGOCALCIFEROL) 1.25 MG (50000 UNIT) PO CAPS: 50000.0000 [IU] | ORAL_CAPSULE | ORAL | 2 refills | Status: DC

## 2017-04-13 MED ORDER — TRAMADOL HCL 50 MG PO TABS: 50.0000 mg | ORAL_TABLET | Freq: Two times a day (BID) | ORAL | 2 refills | Status: DC

## 2017-05-11 ENCOUNTER — Other Ambulatory Visit: Payer: Self-pay | Admitting: Nurse Practitioner

## 2017-05-11 ENCOUNTER — Telehealth: Payer: Self-pay | Admitting: Family Medicine

## 2017-05-11 NOTE — Telephone Encounter (Signed)
Patient is requesting Rx for Xanax and Tramadol.  [facility]

## 2017-05-12 ENCOUNTER — Encounter: Payer: Self-pay | Admitting: Nurse Practitioner

## 2017-05-12 NOTE — Telephone Encounter (Signed)
Patient called to check on this message, she is hoping for this to be done today.

## 2017-05-13 ENCOUNTER — Other Ambulatory Visit: Payer: Self-pay | Admitting: Nurse Practitioner

## 2017-05-13 MED ORDER — ALPRAZOLAM 1 MG PO TABS: ORAL_TABLET | ORAL | 2 refills | Status: DC

## 2017-05-13 MED ORDER — TRAMADOL HCL 50 MG PO TABS: 50.0000 mg | ORAL_TABLET | Freq: Two times a day (BID) | ORAL | 2 refills | Status: DC

## 2017-05-13 NOTE — Telephone Encounter (Signed)
Sent mychart message to clarify

## 2017-05-23 ENCOUNTER — Other Ambulatory Visit: Payer: Self-pay | Admitting: Nurse Practitioner

## 2017-05-23 ENCOUNTER — Encounter: Payer: Self-pay | Admitting: Nurse Practitioner

## 2017-05-23 MED ORDER — ZOLPIDEM TARTRATE 10 MG PO TABS: 10.0000 mg | ORAL_TABLET | Freq: Every evening | ORAL | 2 refills | Status: DC | PRN

## 2017-06-23 ENCOUNTER — Telehealth: Payer: Self-pay | Admitting: Family Medicine

## 2017-06-23 NOTE — Telephone Encounter (Signed)
Patient scheduled office visit tomorrow with Christie Morrow to discuss as recommended by Dr Brett CanalesSteve.

## 2017-06-23 NOTE — Telephone Encounter (Signed)
This is complicated, rec o v with carolyn tomorrow. Did pt go on and initiate mental health counselling as rec per carolyn in June?

## 2017-06-23 NOTE — Telephone Encounter (Signed)
Pt found out yesterday that she's pregnant Figures she's about 4 weeks (not seen OB yet)  Wonders what medicines she should not be taking?  She stopped all her meds about a week ago. Please see med list, takes anxiety & depression meds.    Worried because she's stopped them cold [place]. Pt is an emotional wreck, lots of crying    Please advise

## 2017-06-24 ENCOUNTER — Ambulatory Visit (INDEPENDENT_AMBULATORY_CARE_PROVIDER_SITE_OTHER): Payer: Self-pay | Admitting: Nurse Practitioner

## 2017-06-24 ENCOUNTER — Encounter: Payer: Self-pay | Admitting: Nurse Practitioner

## 2017-06-24 VITALS — BP 114/86 | Ht 67.0 in | Wt 274.0 lb

## 2017-06-24 DIAGNOSIS — I10 Essential (primary) hypertension: Secondary | ICD-10-CM

## 2017-06-24 DIAGNOSIS — Z113 Encounter for screening for infections with a predominantly sexual mode of transmission: Secondary | ICD-10-CM

## 2017-06-24 DIAGNOSIS — Z3A01 Less than 8 weeks gestation of pregnancy: Secondary | ICD-10-CM

## 2017-06-24 NOTE — Patient Instructions (Signed)
First Trimester of Pregnancy The first trimester of pregnancy is from week 1 until the end of week 13 (months 1 through 3). A week after a sperm fertilizes an egg, the egg will implant on the wall of the uterus. This embryo will begin to develop into a baby. Genes from you and your partner will form the baby. The female genes will determine whether the baby will be a boy or a girl. At 6-8 weeks, the eyes and face will be formed, and the heartbeat can be seen on ultrasound. At the end of 12 weeks, all the baby's organs will be formed. Now that you are pregnant, you will want to do everything you can to have a healthy baby. Two of the most important things are to get good prenatal care and to follow your health care provider's instructions. Prenatal care is all the medical care you receive before the baby's birth. This care will help prevent, find, and treat any problems during the pregnancy and childbirth. Body changes during your first trimester Your body goes through many changes during pregnancy. The changes vary from woman to woman.  You may gain or lose a couple of pounds at first.  You may feel sick to your stomach (nauseous) and you may throw up (vomit). If the vomiting is uncontrollable, call your health care provider.  You may tire easily.  You may develop headaches that can be relieved by medicines. All medicines should be approved by your health care provider.  You may urinate more often. Painful urination may mean you have a bladder infection.  You may develop heartburn as a result of your pregnancy.  You may develop constipation because certain hormones are causing the muscles that push stool through your intestines to slow down.  You may develop hemorrhoids or swollen veins (varicose veins).  Your breasts may begin to grow larger and become tender. Your nipples may stick out more, and the tissue that surrounds them (areola) may become darker.  Your gums may bleed and may be  sensitive to brushing and flossing.  Dark spots or blotches (chloasma, mask of pregnancy) may develop on your face. This will likely fade after the baby is born.  Your menstrual periods will stop.  You may have a loss of appetite.  You may develop cravings for certain kinds of food.  You may have changes in your emotions from day to day, such as being excited to be pregnant or being concerned that something may go wrong with the pregnancy and baby.  You may have more vivid and strange dreams.  You may have changes in your hair. These can include thickening of your hair, rapid growth, and changes in texture. Some women also have hair loss during or after pregnancy, or hair that feels dry or thin. Your hair will most likely return to normal after your baby is born.  What to expect at prenatal visits During a routine prenatal visit:  You will be weighed to make sure you and the baby are growing normally.  Your blood pressure will be taken.  Your abdomen will be measured to track your baby's growth.  The fetal heartbeat will be listened to between weeks 10 and 14 of your pregnancy.  Test results from any previous visits will be discussed.  Your health care provider may ask you:  How you are feeling.  If you are feeling the baby move.  If you have had any abnormal symptoms, such as leaking fluid, bleeding, severe headaches,   or abdominal cramping.  If you are using any tobacco products, including cigarettes, chewing tobacco, and electronic cigarettes.  If you have any questions.  Other tests that may be performed during your first trimester include:  Blood tests to find your blood type and to check for the presence of any previous infections. The tests will also be used to check for low iron levels (anemia) and protein on red blood cells (Rh antibodies). Depending on your risk factors, or if you previously had diabetes during pregnancy, you may have tests to check for high blood  sugar that affects pregnant women (gestational diabetes).  Urine tests to check for infections, diabetes, or protein in the urine.  An ultrasound to confirm the proper growth and development of the baby.  Fetal screens for spinal cord problems (spina bifida) and Down syndrome.  HIV (human immunodeficiency virus) testing. Routine prenatal testing includes screening for HIV, unless you choose not to have this test.  You may need other tests to make sure you and the baby are doing well.  Follow these instructions at home: Medicines  Follow your health care provider's instructions regarding medicine use. Specific medicines may be either safe or unsafe to take during pregnancy.  Take a prenatal vitamin that contains at least 600 micrograms (mcg) of folic acid.  If you develop constipation, try taking a stool softener if your health care provider approves. Eating and drinking  Eat a balanced diet that includes fresh fruits and vegetables, whole grains, good sources of protein such as meat, eggs, or tofu, and low-fat dairy. Your health care provider will help you determine the amount of weight gain that is right for you.  Avoid raw meat and uncooked cheese. These carry germs that can cause birth defects in the baby.  Eating four or five small meals rather than three large meals a day may help relieve nausea and vomiting. If you start to feel nauseous, eating a few soda crackers can be helpful. Drinking liquids between meals, instead of during meals, also seems to help ease nausea and vomiting.  Limit foods that are high in fat and processed sugars, such as fried and sweet foods.  To prevent constipation: ? Eat foods that are high in fiber, such as fresh fruits and vegetables, whole grains, and beans. ? Drink enough fluid to keep your urine clear or pale yellow. Activity  Exercise only as directed by your health care provider. Most women can continue their usual exercise routine during  pregnancy. Try to exercise for 30 minutes at least 5 days a week. Exercising will help you: ? Control your weight. ? Stay in shape. ? Be prepared for labor and delivery.  Experiencing pain or cramping in the lower abdomen or lower back is a good sign that you should stop exercising. Check with your health care provider before continuing with normal exercises.  Try to avoid standing for long periods of time. Move your legs often if you must stand in one place for a long time.  Avoid heavy lifting.  Wear low-heeled shoes and practice good posture.  You may continue to have sex unless your health care provider tells you not to. Relieving pain and discomfort  Wear a good support bra to relieve breast tenderness.  Take warm sitz baths to soothe any pain or discomfort caused by hemorrhoids. Use hemorrhoid cream if your health care provider approves.  Rest with your legs elevated if you have leg cramps or low back pain.  If you develop   varicose veins in your legs, wear support hose. Elevate your feet for 15 minutes, 3-4 times a day. Limit salt in your diet. Prenatal care  Schedule your prenatal visits by the twelfth week of pregnancy. They are usually scheduled monthly at first, then more often in the last 2 months before delivery.  Write down your questions. Take them to your prenatal visits.  Keep all your prenatal visits as told by your health care provider. This is important. Safety  Wear your seat belt at all times when driving.  Make a list of emergency phone numbers, including numbers for family, friends, the hospital, and police and fire departments. General instructions  Ask your health care provider for a referral to a local prenatal education class. Begin classes no later than the beginning of month 6 of your pregnancy.  Ask for help if you have counseling or nutritional needs during pregnancy. Your health care provider can offer advice or refer you to specialists for help  with various needs.  Do not use hot tubs, steam rooms, or saunas.  Do not douche or use tampons or scented sanitary pads.  Do not cross your legs for long periods of time.  Avoid cat litter boxes and soil used by cats. These carry germs that can cause birth defects in the baby and possibly loss of the fetus by miscarriage or stillbirth.  Avoid all smoking, herbs, alcohol, and medicines not prescribed by your health care provider. Chemicals in these products affect the formation and growth of the baby.  Do not use any products that contain nicotine or tobacco, such as cigarettes and e-cigarettes. If you need help quitting, ask your health care provider. You may receive counseling support and other resources to help you quit.  Schedule a dentist appointment. At home, brush your teeth with a soft toothbrush and be gentle when you floss. Contact a health care provider if:  You have dizziness.  You have mild pelvic cramps, pelvic pressure, or nagging pain in the abdominal area.  You have persistent nausea, vomiting, or diarrhea.  You have a bad smelling vaginal discharge.  You have pain when you urinate.  You notice increased swelling in your face, hands, legs, or ankles.  You are exposed to fifth disease or chickenpox.  You are exposed to German measles (rubella) and have never had it. Get help right away if:  You have a fever.  You are leaking fluid from your vagina.  You have spotting or bleeding from your vagina.  You have severe abdominal cramping or pain.  You have rapid weight gain or loss.  You vomit blood or material that looks like coffee grounds.  You develop a severe headache.  You have shortness of breath.  You have any kind of trauma, such as from a fall or a car accident. Summary  The first trimester of pregnancy is from week 1 until the end of week 13 (months 1 through 3).  Your body goes through many changes during pregnancy. The changes vary from  woman to woman.  You will have routine prenatal visits. During those visits, your health care provider will examine you, discuss any test results you may have, and talk with you about how you are feeling. This information is not intended to replace advice given to you by your health care provider. Make sure you discuss any questions you have with your health care provider. Document Released: 10/05/2001 Document Revised: 09/22/2016 Document Reviewed: 09/22/2016 Elsevier Interactive Patient Education  2017 Elsevier   Inc.  

## 2017-06-25 ENCOUNTER — Encounter: Payer: Self-pay | Admitting: Nurse Practitioner

## 2017-06-25 LAB — BASIC METABOLIC PANEL
BUN / CREAT RATIO: 11 (ref 9–23)
BUN: 10 mg/dL (ref 6–20)
CO2: 21 mmol/L (ref 20–29)
Calcium: 10.2 mg/dL (ref 8.7–10.2)
Chloride: 104 mmol/L (ref 96–106)
Creatinine, Ser: 0.87 mg/dL (ref 0.57–1.00)
GFR, EST AFRICAN AMERICAN: 101 mL/min/{1.73_m2} (ref >59)
GFR, EST NON AFRICAN AMERICAN: 88 mL/min/{1.73_m2} (ref >59)
Glucose: 94 mg/dL (ref 65–99)
Potassium: 4.4 mmol/L (ref 3.5–5.2)
SODIUM: 140 mmol/L (ref 134–144)

## 2017-06-25 LAB — CBC WITH DIFFERENTIAL/PLATELET
Basophils Absolute: 0 10*3/uL (ref 0.0–0.2)
Basos: 0 %
EOS (ABSOLUTE): 0.2 10*3/uL (ref 0.0–0.4)
EOS: 2 %
HEMATOCRIT: 44.4 % (ref 34.0–46.6)
HEMOGLOBIN: 15.1 g/dL (ref 11.1–15.9)
IMMATURE GRANS (ABS): 0 10*3/uL (ref 0.0–0.1)
Immature Granulocytes: 0 %
LYMPHS ABS: 3.2 10*3/uL — AB (ref 0.7–3.1)
LYMPHS: 29 %
MCH: 32.1 pg (ref 26.6–33.0)
MCHC: 34 g/dL (ref 31.5–35.7)
MCV: 95 fL (ref 79–97)
MONOCYTES: 7 %
Monocytes Absolute: 0.8 10*3/uL (ref 0.1–0.9)
NEUTROS ABS: 6.8 10*3/uL (ref 1.4–7.0)
Neutrophils: 62 %
Platelets: 278 10*3/uL (ref 150–379)
RBC: 4.7 x10E6/uL (ref 3.77–5.28)
RDW: 13.8 % (ref 12.3–15.4)
WBC: 11 10*3/uL — ABNORMAL HIGH (ref 3.4–10.8)

## 2017-06-25 LAB — SYPHILIS: RPR W/REFLEX TO RPR TITER AND TREPONEMAL ANTIBODIES, TRADITIONAL SCREENING AND DIAGNOSIS ALGORITHM: RPR Ser Ql: NONREACTIVE

## 2017-06-25 LAB — HIV ANTIBODY (ROUTINE TESTING W REFLEX): HIV Screen 4th Generation wRfx: NONREACTIVE

## 2017-06-25 LAB — PROGESTERONE: Progesterone: 12.3 ng/mL

## 2017-06-25 LAB — BETA HCG QUANT (REF LAB): hCG Quant: 405 m[IU]/mL

## 2017-06-25 LAB — HEPATITIS C ANTIBODY: Hep C Virus Ab: <0.1 {s_co_ratio} (ref 0.0–0.9)

## 2017-06-25 NOTE — Progress Notes (Signed)
Subjective:  Presents with her husband for follow-up after taking several at home pregnancy test which were positive. Her last menstrual cycle was 05/24/17. Lasts about 5 days, heavy flow. While patient and her husband were separated, patient had a different sexual partner. No pelvic pain discharge or bleeding. No urinary symptoms. No fevers. Had to take progesterone supplements during her last pregnancy to help prevent miscarriage. Patient stopped all medications immediately upon finding out she was pregnant, as best she can tell she only took them a couple of weeks into pregnancy. Has been on Wellbutrin long-term and is having a great deal of difficulty being off medication. Has taken medication during her last pregnancy for anxiety and depression. Has had mild head congestion for the past few days. No ear pain or sore throat. Mild cough.  Objective:   BP 114/86   Ht 5\' 7"  (1.702 m)   Wt 274 lb 0.2 oz (124.3 kg)   LMP 05/24/2017   BMI 42.92 kg/m  NAD. Alert, oriented. Profuse sweating noted. TMs mild clear effusion, no erythema. Skin cool to the touch, no evidence of fever. Pharynx clear. Neck supple with mild soft anterior adenopathy. Lungs clear. Heart regular rate rhythm. Abdomen soft nontender.  Assessment:  Less than [redacted] weeks gestation of pregnancy - Plan: Progesterone, hCG, quantitative, pregnancy  Screening examination for STD (sexually transmitted disease) - Plan: RPR, Hepatitis C antibody, HIV antibody, Chlamydia/Gonococcus/Trichomonas, NAA  Essential hypertension - Plan: Basic metabolic panel, CBC with Differential/Platelet    Plan:  Patient [place] back with [place]. Will be in the area visiting family for a couple of weeks. Advised patient to go ahead and call to make an appointment with an obstetrician in the [place] area. Lab work pending. Patient to seek help immediately if any problems. Discussed early prenatal issues, start daily multivitamin. Also avoid hot  tubs or sonorous. Patient does not have a cat at home. Recommend eating meat well done. Reviewed options regarding her anxiety and depression. Restart bupropion 300 mg daily. May need to stop this depending on advice from her obstetrician. Stop all other medications except for potassium and her albuterol inhaler when necessary. 25 minutes was spent with the patient. Greater than half the time was spent in discussion and answering questions and counseling regarding the issues that the patient came in for today.

## 2017-06-27 ENCOUNTER — Encounter: Payer: Self-pay | Admitting: Nurse Practitioner

## 2017-06-27 LAB — CHLAMYDIA/GONOCOCCUS/TRICHOMONAS, NAA
Chlamydia by NAA: NEGATIVE
Gonococcus by NAA: NEGATIVE
TRICH VAG BY NAA: NEGATIVE

## 2017-06-28 ENCOUNTER — Encounter: Payer: Self-pay | Admitting: Nurse Practitioner

## 2017-07-01 ENCOUNTER — Other Ambulatory Visit: Payer: Self-pay | Admitting: Obstetrics and Gynecology

## 2017-07-01 DIAGNOSIS — O3680X0 Pregnancy with inconclusive fetal viability, not applicable or unspecified: Secondary | ICD-10-CM

## 2017-07-04 ENCOUNTER — Ambulatory Visit (INDEPENDENT_AMBULATORY_CARE_PROVIDER_SITE_OTHER): Payer: Medicaid Other

## 2017-07-04 ENCOUNTER — Other Ambulatory Visit: Payer: Self-pay | Admitting: Obstetrics and Gynecology

## 2017-07-04 DIAGNOSIS — O3680X0 Pregnancy with inconclusive fetal viability, not applicable or unspecified: Secondary | ICD-10-CM | POA: Diagnosis not present

## 2017-07-04 DIAGNOSIS — Z3A01 Less than 8 weeks gestation of pregnancy: Secondary | ICD-10-CM

## 2017-07-04 NOTE — Progress Notes (Signed)
US W/TV: 5+6 wks GS w/ys,no fetal pole visualized,normal ovaries bilat,GS 11.1 mm

## 2017-07-07 ENCOUNTER — Telehealth: Payer: Self-pay | Admitting: Obstetrics & Gynecology

## 2017-07-07 NOTE — Telephone Encounter (Signed)
Pt called stating that she is supposed to be having a crown put on a tooth on Monday and wanted to know if it was okay to proceed with that. Informed pt that it was ok.

## 2017-07-14 ENCOUNTER — Encounter: Payer: Self-pay | Admitting: Nurse Practitioner

## 2017-07-14 ENCOUNTER — Telehealth: Payer: Self-pay | Admitting: Advanced Practice Midwife

## 2017-07-14 ENCOUNTER — Other Ambulatory Visit: Payer: Self-pay | Admitting: Obstetrics and Gynecology

## 2017-07-14 DIAGNOSIS — O3680X0 Pregnancy with inconclusive fetal viability, not applicable or unspecified: Secondary | ICD-10-CM

## 2017-07-14 NOTE — Telephone Encounter (Signed)
Patient called stating she is still taking Wellbutrin but is having periods of anxiety and doesn't know if it's safe to take during pregnancy. Informed patient that Xanax is not safe to take during pregnancy and should talk with a provider about prescribing her something else for her anxiety. Has dating U/S on Monday but will get her with a provider that day. Verbalized understanding.

## 2017-07-18 ENCOUNTER — Ambulatory Visit (INDEPENDENT_AMBULATORY_CARE_PROVIDER_SITE_OTHER): Payer: Medicaid Other | Admitting: Women's Health

## 2017-07-18 ENCOUNTER — Ambulatory Visit (INDEPENDENT_AMBULATORY_CARE_PROVIDER_SITE_OTHER): Payer: Medicaid Other

## 2017-07-18 ENCOUNTER — Encounter: Payer: Self-pay | Admitting: Women's Health

## 2017-07-18 VITALS — BP 110/88 | HR 108 | Ht 67.0 in | Wt 278.0 lb

## 2017-07-18 DIAGNOSIS — O3680X0 Pregnancy with inconclusive fetal viability, not applicable or unspecified: Secondary | ICD-10-CM

## 2017-07-18 DIAGNOSIS — Z3A01 Less than 8 weeks gestation of pregnancy: Secondary | ICD-10-CM

## 2017-07-18 DIAGNOSIS — F418 Other specified anxiety disorders: Secondary | ICD-10-CM | POA: Diagnosis not present

## 2017-07-18 NOTE — Progress Notes (Signed)
[place] 7+6 wks,single IUP w/ys,pos fht 162 bpm,normal ovaries bilat,crl 11.49, EDD by LMP 02/28/2018

## 2017-07-18 NOTE — Patient Instructions (Signed)

## 2017-07-18 NOTE — Progress Notes (Signed)
   [facility] Visit  Patient name: Christie Morrow MRN [id number]  Date of birth: 1981/03/20 CC & HPI:  Christie Morrow is a 36 y.o. G1P0 Caucasian female at [redacted]w[redacted]d being seen today for +HPT. Dating u/s today. Has depression/anxiety, was ta/king wellbutrin  daily, cymbalta  daily, xanax 0.5mg  BID, Ambien- stopped all but wellbutrin and she cut dose back to  daily on that. She feels she is not doing so well since stopping meds, denies SI/HI, but has frequent rage/anger attacks. Is interested in counseling/therapy, does not have insurance right now and does not want to pay out of pocket. She has applied for pregnancy [facility]- states she will let us know when active so we can send referral. Discussed counseling/therapy +/- restarting Cymbalta. Does want to go ahead and restart Cymbalta, feels she needs it. She was also taking lasix  daily with a potassium supplement d/t peripheral fluid retention, doing well off of this. Denies n/v. Is taking pnv daily.   Patient's last menstrual period was 05/24/2017 (exact date).  Review of Systems:   Patient denies any headaches, hearing loss, fatigue, blurred vision, shortness of breath, chest pain, abdominal pain, problems with bowel movements, urination, or intercourse. No joint pain or mood swings.  Pertinent History Reviewed:  Reviewed past medical,surgical and family history.  Reviewed problem list, medications and allergies.  Objective Findings:   Vitals:   07/18/17 1547  BP: 110/88  Pulse: (!) 108  Weight: 278 lb (126.1 kg)  Height:  (1.702 m)    Body mass index is 43.54 kg/m.  Physical Examination: General appearance - well appearing, and in no distress Mental status - alert, oriented to person, place, and time Physical Examination: Chest - clear to auscultation, no wheezes, rales or rhonchi, symmetric air entry Heart - normal rate and regular rhythm Abdomen - soft, nontender  Today's Dating U/S: [place] 7+6 wks,single  IUP w/ys, pos fht 162 bpm,normal ovaries bilat,crl 11.49 c/w [redacted]w[redacted]d  Assessment & Plan:   1) [redacted]w[redacted]d pregnant 2) Depression/anxiety, anger/rage attacks> OK to continue wellbutrin  daily, discussed she is early in 1st trimester, so least amt of medication that works is the best, cymbalta is Cat C, pt feels she needs to restart, so to take  daily x 1wk, then resume  daily dosage. Let us know when [facility] active and we will send referral for counseling/therapy  Return in about 3 weeks (around 08/08/2017) for intake w/ tish & new ob.  Marge Duncans CNM, [facility] 07/18/2017 4:29 PM

## 2017-08-01 ENCOUNTER — Ambulatory Visit: Payer: BLUE CROSS/BLUE SHIELD | Admitting: Nurse Practitioner

## 2017-08-03 ENCOUNTER — Encounter: Payer: Self-pay | Admitting: Nurse Practitioner

## 2017-08-03 NOTE — Telephone Encounter (Signed)
Patient is [redacted] weeks pregnant currently

## 2017-08-04 ENCOUNTER — Other Ambulatory Visit: Payer: Self-pay | Admitting: Nurse Practitioner

## 2017-08-04 MED ORDER — BUPROPION HCL ER (XL) 300 MG PO TB24: 300.0000 mg | ORAL_TABLET | Freq: Every day | ORAL | 2 refills | Status: DC

## 2017-08-04 MED ORDER — DULOXETINE HCL 60 MG PO CPEP: 60.0000 mg | ORAL_CAPSULE | Freq: Every day | ORAL | 2 refills | Status: DC

## 2017-08-08 ENCOUNTER — Ambulatory Visit: Payer: Medicaid Other | Admitting: *Deleted

## 2017-08-08 ENCOUNTER — Encounter: Payer: Self-pay | Admitting: *Deleted

## 2017-08-08 ENCOUNTER — Ambulatory Visit (INDEPENDENT_AMBULATORY_CARE_PROVIDER_SITE_OTHER): Payer: Medicaid Other | Admitting: Women's Health

## 2017-08-08 ENCOUNTER — Encounter: Payer: Self-pay | Admitting: Women's Health

## 2017-08-08 VITALS — BP 126/86 | HR 104 | Wt 280.0 lb

## 2017-08-08 DIAGNOSIS — Z3A10 10 weeks gestation of pregnancy: Secondary | ICD-10-CM | POA: Diagnosis not present

## 2017-08-08 DIAGNOSIS — B009 Herpesviral infection, unspecified: Secondary | ICD-10-CM | POA: Diagnosis not present

## 2017-08-08 DIAGNOSIS — F418 Other specified anxiety disorders: Secondary | ICD-10-CM

## 2017-08-08 DIAGNOSIS — R8271 Bacteriuria: Secondary | ICD-10-CM

## 2017-08-08 DIAGNOSIS — I10 Essential (primary) hypertension: Secondary | ICD-10-CM | POA: Insufficient documentation

## 2017-08-08 DIAGNOSIS — O99341 Other mental disorders complicating pregnancy, first trimester: Secondary | ICD-10-CM

## 2017-08-08 DIAGNOSIS — Z3682 Encounter for antenatal screening for nuchal translucency: Secondary | ICD-10-CM

## 2017-08-08 DIAGNOSIS — Z23 Encounter for immunization: Secondary | ICD-10-CM | POA: Diagnosis not present

## 2017-08-08 DIAGNOSIS — O0990 Supervision of high risk pregnancy, unspecified, unspecified trimester: Secondary | ICD-10-CM | POA: Insufficient documentation

## 2017-08-08 DIAGNOSIS — Z1389 Encounter for screening for other disorder: Secondary | ICD-10-CM | POA: Diagnosis not present

## 2017-08-08 DIAGNOSIS — O9989 Other specified diseases and conditions complicating pregnancy, childbirth and the puerperium: Secondary | ICD-10-CM

## 2017-08-08 DIAGNOSIS — O98511 Other viral diseases complicating pregnancy, first trimester: Secondary | ICD-10-CM

## 2017-08-08 DIAGNOSIS — O10911 Unspecified pre-existing hypertension complicating pregnancy, first trimester: Secondary | ICD-10-CM | POA: Diagnosis not present

## 2017-08-08 DIAGNOSIS — O09299 Supervision of pregnancy with other poor reproductive or obstetric history, unspecified trimester: Secondary | ICD-10-CM | POA: Diagnosis not present

## 2017-08-08 DIAGNOSIS — O99891 Other specified diseases and conditions complicating pregnancy: Secondary | ICD-10-CM | POA: Insufficient documentation

## 2017-08-08 DIAGNOSIS — O0991 Supervision of high risk pregnancy, unspecified, first trimester: Secondary | ICD-10-CM | POA: Diagnosis not present

## 2017-08-08 DIAGNOSIS — O10919 Unspecified pre-existing hypertension complicating pregnancy, unspecified trimester: Secondary | ICD-10-CM

## 2017-08-08 DIAGNOSIS — Z331 Pregnant state, incidental: Secondary | ICD-10-CM | POA: Diagnosis not present

## 2017-08-08 DIAGNOSIS — Z3481 Encounter for supervision of other normal pregnancy, first trimester: Secondary | ICD-10-CM

## 2017-08-08 DIAGNOSIS — Z3401 Encounter for supervision of normal first pregnancy, first trimester: Secondary | ICD-10-CM

## 2017-08-08 LAB — POCT URINALYSIS DIPSTICK
GLUCOSE UA: NEGATIVE
Ketones, UA: NEGATIVE
Leukocytes, UA: NEGATIVE
NITRITE UA: POSITIVE
PROTEIN UA: NEGATIVE
RBC UA: NEGATIVE

## 2017-08-08 MED ORDER — VALACYCLOVIR HCL 500 MG PO TABS: 500.0000 mg | ORAL_TABLET | Freq: Two times a day (BID) | ORAL | 6 refills | Status: DC

## 2017-08-08 MED ORDER — CEPHALEXIN 500 MG PO CAPS: 500.0000 mg | ORAL_CAPSULE | Freq: Four times a day (QID) | ORAL | 0 refills | Status: DC

## 2017-08-08 NOTE — Patient Instructions (Addendum)
Begin taking a  baby aspirin daily at 12 weeks of pregnancy (10/23) to decrease risk of preeclampsia during pregnancy   Nausea & Vomiting  Have saltine crackers or pretzels by your bed and eat a few bites before you raise your head out of bed in the morning  Eat small frequent meals throughout the day instead of large meals  Drink plenty of fluids throughout the day to stay hydrated, just don't drink a lot of fluids with your meals.  This can make your stomach fill up faster making you feel sick  Do not brush your teeth right after you eat  Products with real ginger are good for nausea, like ginger ale and ginger hard candy Make sure it says made with real ginger!  Sucking on sour candy like lemon heads is also good for nausea  If your prenatal vitamins make you nauseated, take them at night so you will sleep through the nausea  Sea Bands  If you feel like you need medicine for the nausea & vomiting please let us know  If you are unable to keep any fluids or food down please let us know   Constipation  Drink plenty of fluid, preferably water, throughout the day  Eat foods high in fiber such as fruits, vegetables, and grains  Exercise, such as walking, is a good way to keep your bowels regular  Drink warm fluids, especially warm prune juice, or decaf coffee  Eat a 1/2 cup of real oatmeal (not instant), 1/2 cup applesauce, and 1/2-1 cup warm prune juice every day  If needed, you may take Colace (docusate sodium) stool softener once or twice a day to help keep the stool soft. If you are pregnant, wait until you are out of your first trimester (12-14 weeks of pregnancy)  If you still are having problems with constipation, you may take Miralax once daily as needed to help keep your bowels regular.  If you are pregnant, wait until you are out of your first trimester (12-14 weeks of pregnancy)   First Trimester of Pregnancy The first trimester of pregnancy is from week 1 until  the end of week 12 (months 1 through 3). A week after a sperm fertilizes an egg, the egg will implant on the wall of the uterus. This embryo will begin to develop into a baby. Genes from you and your partner are forming the baby. The female genes determine whether the baby is a boy or a girl. At 6-8 weeks, the eyes and face are formed, and the heartbeat can be seen on ultrasound. At the end of 12 weeks, all the baby's organs are formed.  Now that you are pregnant, you will want to do everything you can to have a healthy baby. Two of the most important things are to get good prenatal care and to follow your health care provider's instructions. Prenatal care is all the medical care you receive before the baby's birth. This care will help prevent, find, and treat any problems during the pregnancy and childbirth. BODY CHANGES Your body goes through many changes during pregnancy. The changes vary from woman to woman.   You may gain or lose a couple of pounds at first.  You may feel sick to your stomach (nauseous) and throw up (vomit). If the vomiting is uncontrollable, call your health care provider.  You may tire easily.  You may develop headaches that can be relieved by medicines approved by your health care provider.  You may urinate  more often. Painful urination may mean you have a bladder infection.  You may develop heartburn as a result of your pregnancy.  You may develop constipation because certain hormones are causing the muscles that push waste through your intestines to slow down.  You may develop hemorrhoids or swollen, bulging veins (varicose veins).  Your breasts may begin to grow larger and become tender. Your nipples may stick out more, and the tissue that surrounds them (areola) may become darker.  Your gums may bleed and may be sensitive to brushing and flossing.  Dark spots or blotches (chloasma, mask of pregnancy) may develop on your face. This will likely fade after the baby is  born.  Your menstrual periods will stop.  You may have a loss of appetite.  You may develop cravings for certain kinds of food.  You may have changes in your emotions from day to day, such as being excited to be pregnant or being concerned that something may go wrong with the pregnancy and baby.  You may have more vivid and strange dreams.  You may have changes in your hair. These can include thickening of your hair, rapid growth, and changes in texture. Some women also have hair loss during or after pregnancy, or hair that feels dry or thin. Your hair will most likely return to normal after your baby is born. WHAT TO EXPECT AT YOUR PRENATAL VISITS During a routine prenatal visit:  You will be weighed to make sure you and the baby are growing normally.  Your blood pressure will be taken.  Your abdomen will be measured to track your baby's growth.  The fetal heartbeat will be listened to starting around week 10 or 12 of your pregnancy.  Test results from any previous visits will be discussed. Your health care provider may ask you:  How you are feeling.  If you are feeling the baby move.  If you have had any abnormal symptoms, such as leaking fluid, bleeding, severe headaches, or abdominal cramping.  If you have any questions. Other tests that may be performed during your first trimester include:  Blood tests to find your blood type and to check for the presence of any previous infections. They will also be used to check for low iron levels (anemia) and Rh antibodies. Later in the pregnancy, blood tests for diabetes will be done along with other tests if problems develop.  Urine tests to check for infections, diabetes, or protein in the urine.  An ultrasound to confirm the proper growth and development of the baby.  An amniocentesis to check for possible genetic problems.  Fetal screens for spina bifida and Down syndrome.  You may need other tests to make sure you and the  baby are doing well. HOME CARE INSTRUCTIONS  Medicines  Follow your health care provider's instructions regarding medicine use. Specific medicines may be either safe or unsafe to take during pregnancy.  Take your prenatal vitamins as directed.  If you develop constipation, try taking a stool softener if your health care provider approves. Diet  Eat regular, well-balanced meals. Choose a variety of foods, such as meat or vegetable-based protein, fish, milk and low-fat dairy products, vegetables, fruits, and whole grain breads and cereals. Your health care provider will help you determine the amount of weight gain that is right for you.  Avoid raw meat and uncooked cheese. These carry germs that can cause birth defects in the baby.  Eating four or five small meals rather than three   large meals a day may help relieve nausea and vomiting. If you start to feel nauseous, eating a few soda crackers can be helpful. Drinking liquids between meals instead of during meals also seems to help nausea and vomiting.  If you develop constipation, eat more high-fiber foods, such as fresh vegetables or fruit and whole grains. Drink enough fluids to keep your urine clear or pale yellow. Activity and Exercise  Exercise only as directed by your health care provider. Exercising will help you:  Control your weight.  Stay in shape.  Be prepared for labor and delivery.  Experiencing pain or cramping in the lower abdomen or low back is a good sign that you should stop exercising. Check with your health care provider before continuing normal exercises.  Try to avoid standing for long periods of time. Move your legs often if you must stand in one place for a long time.  Avoid heavy lifting.  Wear low-heeled shoes, and practice good posture.  You may continue to have sex unless your health care provider directs you otherwise. Relief of Pain or Discomfort  Wear a good support bra for breast tenderness.    Take warm sitz baths to soothe any pain or discomfort caused by hemorrhoids. Use hemorrhoid cream if your health care provider approves.   Rest with your legs elevated if you have leg cramps or low back pain.  If you develop varicose veins in your legs, wear support hose. Elevate your feet for 15 minutes, 3-4 times a day. Limit salt in your diet. Prenatal Care  Schedule your prenatal visits by the twelfth week of pregnancy. They are usually scheduled monthly at first, then more often in the last 2 months before delivery.  Write down your questions. Take them to your prenatal visits.  Keep all your prenatal visits as directed by your health care provider. Safety  Wear your seat belt at all times when driving.  Make a list of emergency phone numbers, including numbers for family, friends, the hospital, and police and fire departments. General Tips  Ask your health care provider for a referral to a local prenatal education class. Begin classes no later than at the beginning of month 6 of your pregnancy.  Ask for help if you have counseling or nutritional needs during pregnancy. Your health care provider can offer advice or refer you to specialists for help with various needs.  Do not use hot tubs, steam rooms, or saunas.  Do not douche or use tampons or scented sanitary pads.  Do not cross your legs for long periods of time.  Avoid cat litter boxes and soil used by cats. These carry germs that can cause birth defects in the baby and possibly loss of the fetus by miscarriage or stillbirth.  Avoid all smoking, herbs, alcohol, and medicines not prescribed by your health care provider. Chemicals in these affect the formation and growth of the baby.  Schedule a dentist appointment. At home, brush your teeth with a soft toothbrush and be gentle when you floss. SEEK MEDICAL CARE IF:   You have dizziness.  You have mild pelvic cramps, pelvic pressure, or nagging pain in the abdominal  area.  You have persistent nausea, vomiting, or diarrhea.  You have a bad smelling vaginal discharge.  You have pain with urination.  You notice increased swelling in your face, hands, legs, or ankles. SEEK IMMEDIATE MEDICAL CARE IF:   You have a fever.  You are leaking fluid from your vagina.  You   have spotting or bleeding from your vagina.  You have severe abdominal cramping or pain.  You have rapid weight gain or loss.  You vomit blood or material that looks like coffee grounds.  You are exposed to German measles and have never had them.  You are exposed to fifth disease or chickenpox.  You develop a severe headache.  You have shortness of breath.  You have any kind of trauma, such as from a fall or a car accident. Document Released: 10/05/2001 Document Revised: 02/25/2014 Document Reviewed: 08/21/2013 Exit[employer] Patient Information 2015 [employer]. This information is not intended to replace advice given to you by your health care provider. Make sure you discuss any questions you have with your health care provider.   

## 2017-08-08 NOTE — Progress Notes (Addendum)
Initial Obstetrical Visit Patient name: Christie Morrow MRN [id number]  Date of birth: Jun 08, 1981 CC & HPI:  Christie Morrow is a 36 y.o. G2P1011 Caucasian female at [redacted]w[redacted]d by LMP c/w 7wk u/s, with an Estimated Date of Delivery: 02/28/18 being seen today for her initial obstetrical visit. Patient's last menstrual period was 05/24/2017 (exact date).. Her obstetrical history is significant for SAB x 1, term SVB x 1 with manual placental extraction and PPH not requiring transfusion, baby weighed 9lb5oz- denies GDM.  CHTN- no meds. Was on Lasix prior to pregnancy for peripheral edema. Dep/anx- was taking wellbutrin, cymbalta, xanax, and ambien prior to pregnancy- she had stopped all but wellbutrin, however when I saw her for pregnancy test visit we restarted cymbalta as well, feels much better and no longer feels she needs counseling/therapy.  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  Last pap 2018 at [facility]. Results were: normal Review of Systems:   Denies cramping/contractions, leakage of fluid, vaginal bleeding, abnormal vaginal discharge w/ itching/odor/irritation, headaches, visual changes, shortness of breath, chest pain, abdominal pain, severe nausea/vomiting, or problems with urination or bowel movements unless otherwise stated above.  Pertinent History Reviewed:  Reviewed past medical,surgical, social and family history.  Reviewed problem list, medications and allergies. OB History  Gravida Para Term Preterm AB Living  SAB TAB Ectopic Multiple Live Births  1       1    # Outcome Date GA Lbr Len/2nd Weight Sex Delivery Anes PTL Lv  3 Current           2 Term 06/28/10 [redacted]w[redacted]d  9 lb 5 oz (4.224 kg) F Vag-Spont EPI N LIV     Birth Comments: PPH  1 SAB              Objective Findings:   Vitals:   08/08/17 1016  BP: 126/86  Pulse: (!) 104  Weight: 280 lb (127 kg)  Body mass index is 43.85 kg/m.  Physical  Examination: General appearance - well appearing, and in no distress Mental status - alert, oriented to person, place, and time Psych:  She has a normal mood and affect Skin - warm and dry, normal color, no suspicious lesions noted Chest - effort normal, all lung fields clear to auscultation bilaterally Heart - normal rate and regular rhythm Abdomen - soft, nontender Extremities:  No swelling or varicosities noted Pelvic - VULVA: 2 small HSV lesions Rt upper apex of labia majora/mons area  Thin prep pap is not done  Fetal Heart Rate (bpm): + u/s via informal transabdominal u/s, active fetus  Results for orders placed or performed in visit on 08/08/17 (from the past 24 hour(s))  POCT urinalysis dipstick   Collection Time: 08/08/17 10:42 AM  Result Value Ref Range   Color, UA     Clarity, UA     Glucose, UA neg    Bilirubin, UA     Ketones, UA neg    Spec Grav, UA  1.010 - 1.025   Blood, UA neg    pH, UA  5.0 - 8.0   Protein, UA neg    Urobilinogen, UA  0.2 or 1.0 E.U./dL   Nitrite, UA positive    Leukocytes, UA Negative Negative    Assessment & Plan:  1) High-Risk Pregnancy G3P1011 at [redacted]w[redacted]d with an Estimated Date of Delivery: 02/28/18   2) Initial OB  visit  3) CHTN- no meds, will get baseline labs. 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   Orders Placed This Encounter  Procedures  . GC/Chlamydia Probe Amp  . Urine Culture  . US Fetal Nuchal Translucency Measurement  . Flu Vaccine QUAD 36+ mos IM  . Pain Management Screening Profile (10S)  . CBC  . Hepatitis B surface antigen  . HIV antibody  . Varicella zoster antibody, IgG  . Urinalysis, Routine w reflex microscopic  . Rubella screen  . RPR  . Comprehensive metabolic panel  . Protein, urine, 24 hour  . POCT urinalysis dipstick  . ABO/Rh  . Antibody screen    Marge Duncans CNM, [facility] 08/08/2017 1:02 PM

## 2017-08-09 LAB — URINALYSIS, ROUTINE W REFLEX MICROSCOPIC
BILIRUBIN UA: NEGATIVE
GLUCOSE, UA: NEGATIVE
KETONES UA: NEGATIVE
NITRITE UA: NEGATIVE
PROTEIN UA: NEGATIVE
Specific Gravity, UA: 1.025 (ref 1.005–1.030)
UUROB: 0.2 mg/dL (ref 0.2–1.0)
pH, UA: 5.5 (ref 5.0–7.5)

## 2017-08-09 LAB — RPR: RPR Ser Ql: NONREACTIVE

## 2017-08-09 LAB — MICROSCOPIC EXAMINATION: Casts: NONE SEEN /lpf

## 2017-08-09 LAB — PMP SCREEN PROFILE (10S), URINE
Amphetamine Scrn, Ur: NEGATIVE ng/mL
BARBITURATE SCREEN URINE: NEGATIVE ng/mL
BENZODIAZEPINE SCREEN, URINE: NEGATIVE ng/mL
CANNABINOIDS UR QL SCN: POSITIVE ng/mL — AB
CREATININE(CRT), U: 218.5 mg/dL (ref 20.0–300.0)
Cocaine (Metab) Scrn, Ur: NEGATIVE ng/mL
METHADONE SCREEN, URINE: NEGATIVE ng/mL
OPIATE SCREEN URINE: NEGATIVE ng/mL
OXYCODONE+OXYMORPHONE UR QL SCN: NEGATIVE ng/mL
PROPOXYPHENE SCREEN URINE: NEGATIVE ng/mL
Ph of Urine: 5.6 (ref 4.5–8.9)
Phencyclidine Qn, Ur: NEGATIVE ng/mL

## 2017-08-09 LAB — HIV ANTIBODY (ROUTINE TESTING W REFLEX): HIV SCREEN 4TH GENERATION: NONREACTIVE

## 2017-08-09 LAB — COMPREHENSIVE METABOLIC PANEL
A/G RATIO: 1.7 (ref 1.2–2.2)
ALBUMIN: 4.5 g/dL (ref 3.5–5.5)
ALT: 23 IU/L (ref 0–32)
AST: 15 IU/L (ref 0–40)
Alkaline Phosphatase: 119 IU/L — ABNORMAL HIGH (ref 39–117)
BUN / CREAT RATIO: 10 (ref 9–23)
BUN: 7 mg/dL (ref 6–20)
CHLORIDE: 101 mmol/L (ref 96–106)
CO2: 20 mmol/L (ref 20–29)
Calcium: 9.5 mg/dL (ref 8.7–10.2)
Creatinine, Ser: 0.68 mg/dL (ref 0.57–1.00)
GFR calc non Af Amer: 115 mL/min/{1.73_m2} (ref >59)
GFR, EST AFRICAN AMERICAN: 133 mL/min/{1.73_m2} (ref >59)
Globulin, Total: 2.6 g/dL (ref 1.5–4.5)
Glucose: 75 mg/dL (ref 65–99)
POTASSIUM: 4.3 mmol/L (ref 3.5–5.2)
SODIUM: 140 mmol/L (ref 134–144)
TOTAL PROTEIN: 7.1 g/dL (ref 6.0–8.5)

## 2017-08-09 LAB — CBC
HEMATOCRIT: 40.2 % (ref 34.0–46.6)
HEMOGLOBIN: 13.4 g/dL (ref 11.1–15.9)
MCH: 32.1 pg (ref 26.6–33.0)
MCHC: 33.3 g/dL (ref 31.5–35.7)
MCV: 96 fL (ref 79–97)
Platelets: 298 10*3/uL (ref 150–379)
RBC: 4.17 x10E6/uL (ref 3.77–5.28)
RDW: 13.2 % (ref 12.3–15.4)
WBC: 10.6 10*3/uL (ref 3.4–10.8)

## 2017-08-09 LAB — HEPATITIS B SURFACE ANTIGEN: Hepatitis B Surface Ag: NEGATIVE

## 2017-08-09 LAB — VARICELLA ZOSTER ANTIBODY, IGG: VARICELLA: 1038 {index} (ref >165)

## 2017-08-09 LAB — RUBELLA SCREEN: RUBELLA: 4.41 {index} (ref >0.99)

## 2017-08-09 LAB — ABO/RH: RH TYPE: NEGATIVE

## 2017-08-09 LAB — ANTIBODY SCREEN: ANTIBODY SCREEN: NEGATIVE

## 2017-08-10 ENCOUNTER — Other Ambulatory Visit: Payer: Self-pay | Admitting: Women's Health

## 2017-08-10 ENCOUNTER — Encounter: Payer: Self-pay | Admitting: Women's Health

## 2017-08-10 DIAGNOSIS — F1290 Cannabis use, unspecified, uncomplicated: Secondary | ICD-10-CM | POA: Insufficient documentation

## 2017-08-10 DIAGNOSIS — O26899 Other specified pregnancy related conditions, unspecified trimester: Secondary | ICD-10-CM

## 2017-08-10 DIAGNOSIS — Z6791 Unspecified blood type, Rh negative: Secondary | ICD-10-CM | POA: Insufficient documentation

## 2017-08-10 LAB — URINE CULTURE

## 2017-08-10 LAB — GC/CHLAMYDIA PROBE AMP
Chlamydia trachomatis, NAA: NEGATIVE
NEISSERIA GONORRHOEAE BY PCR: NEGATIVE

## 2017-08-11 LAB — PROTEIN, URINE, 24 HOUR
Protein, 24H Urine: 316 mg/24 hr — ABNORMAL HIGH (ref 30–150)
Protein, Ur: 15.4 mg/dL

## 2017-08-16 ENCOUNTER — Encounter: Payer: Self-pay | Admitting: Women's Health

## 2017-08-18 ENCOUNTER — Encounter: Payer: Self-pay | Admitting: Women's Health

## 2017-08-18 DIAGNOSIS — R87810 Cervical high risk human papillomavirus (HPV) DNA test positive: Secondary | ICD-10-CM | POA: Insufficient documentation

## 2017-08-19 ENCOUNTER — Ambulatory Visit (INDEPENDENT_AMBULATORY_CARE_PROVIDER_SITE_OTHER): Payer: Medicaid Other | Admitting: Nurse Practitioner

## 2017-08-19 ENCOUNTER — Encounter: Payer: Self-pay | Admitting: Nurse Practitioner

## 2017-08-19 VITALS — BP 120/78 | Ht 67.0 in | Wt 281.0 lb

## 2017-08-19 DIAGNOSIS — F418 Other specified anxiety disorders: Secondary | ICD-10-CM

## 2017-08-21 ENCOUNTER — Encounter: Payer: Self-pay | Admitting: Nurse Practitioner

## 2017-08-21 NOTE — Progress Notes (Signed)
Subjective:  Presents for recheck on her anxiety and depression. Doing much better since restarting Cymbalta and Wellbutrin per obstetrician. [place] and daughter once they find a house. [facility]  Depression screen PHQ 2/9 08/08/2017  Decreased Interest 1  Down, Depressed, Hopeless 0  PHQ - 2 Score 1  Altered sleeping 0  Tired, decreased energy 1  Change in appetite 0  Feeling bad or failure about yourself  0  Trouble concentrating 0  Moving slowly or fidgety/restless 0  Suicidal thoughts 0  PHQ-9 Score 2  Difficult doing work/chores Not difficult at all   GAD 7 : Generalized Anxiety Score 08/19/2017  Nervous, Anxious, on Edge 0  Control/stop worrying 0  Worry too much - different things 1  Trouble relaxing 0  Restless 0  Easily annoyed or irritable 1  Afraid - awful might happen 0  Total GAD 7 Score 2      Objective:   BP 120/78   Ht 5\' 7"  (1.702 m)   Wt 281 lb (127.5 kg)   LMP 05/24/2017 (Exact Date)   BMI 44.01 kg/m  NAD. Alert, oriented. Cheerful, calm affect. Thoughts logical, coherent and relevant. Dressed appropriately. Making good eye contact. Lungs clear. Heart RRR.   Assessment:   Problem List Items Addressed This Visit      Other   Depression with anxiety - Primary       Plan: continue current medications as directed. Continue follow up with OB. Return in about 4 months (around 12/20/2017) for recheck.

## 2017-08-22 ENCOUNTER — Encounter: Payer: Self-pay | Admitting: Women's Health

## 2017-08-22 ENCOUNTER — Ambulatory Visit (INDEPENDENT_AMBULATORY_CARE_PROVIDER_SITE_OTHER): Payer: Medicaid Other

## 2017-08-22 ENCOUNTER — Ambulatory Visit (INDEPENDENT_AMBULATORY_CARE_PROVIDER_SITE_OTHER): Payer: Medicaid Other | Admitting: Women's Health

## 2017-08-22 VITALS — BP 110/60 | HR 84 | Wt 280.0 lb

## 2017-08-22 DIAGNOSIS — O2341 Unspecified infection of urinary tract in pregnancy, first trimester: Secondary | ICD-10-CM

## 2017-08-22 DIAGNOSIS — Z3682 Encounter for antenatal screening for nuchal translucency: Secondary | ICD-10-CM

## 2017-08-22 DIAGNOSIS — Z331 Pregnant state, incidental: Secondary | ICD-10-CM | POA: Diagnosis not present

## 2017-08-22 DIAGNOSIS — O0991 Supervision of high risk pregnancy, unspecified, first trimester: Secondary | ICD-10-CM

## 2017-08-22 DIAGNOSIS — Z3A12 12 weeks gestation of pregnancy: Secondary | ICD-10-CM | POA: Diagnosis not present

## 2017-08-22 DIAGNOSIS — O10911 Unspecified pre-existing hypertension complicating pregnancy, first trimester: Secondary | ICD-10-CM | POA: Diagnosis not present

## 2017-08-22 DIAGNOSIS — Z1389 Encounter for screening for other disorder: Secondary | ICD-10-CM | POA: Diagnosis not present

## 2017-08-22 DIAGNOSIS — O0990 Supervision of high risk pregnancy, unspecified, unspecified trimester: Secondary | ICD-10-CM

## 2017-08-22 LAB — POCT URINALYSIS DIPSTICK
Blood, UA: NEGATIVE
GLUCOSE UA: NEGATIVE
Ketones, UA: NEGATIVE
Leukocytes, UA: NEGATIVE
NITRITE UA: NEGATIVE
PROTEIN UA: NEGATIVE

## 2017-08-22 NOTE — Progress Notes (Signed)
US 12+6 wks,measurement c/w dates,normal ovaries bilat,fhr 179 bpm,crl 64.01 mm,ant pl gr 0,NB present,NT 1.7 mm

## 2017-08-22 NOTE — Patient Instructions (Signed)
Christie Morrow, I greatly value your feedback.  If you receive a survey following your visit with us today, we appreciate you taking the time to fill it out.  Thanks, Joellyn HaffKim Devell Parkerson, CNM, WHNP-BC   Second Trimester of Pregnancy The second trimester is from week 14 through week 27 (months 4 through 6). The second trimester is often a time when you feel your best. Your body has adjusted to being pregnant, and you begin to feel better physically. Usually, morning sickness has lessened or quit completely, you may have more energy, and you may have an increase in appetite. The second trimester is also a time when the fetus is growing rapidly. At the end of the sixth month, the fetus is about 9 inches long and weighs about 1 pounds. You will likely begin to feel the baby move (quickening) between 16 and 20 weeks of pregnancy. Body changes during your second trimester Your body continues to go through many changes during your second trimester. The changes vary from woman to woman.  Your weight will continue to increase. You will notice your lower abdomen bulging out.  You may begin to get stretch marks on your hips, abdomen, and breasts.  You may develop headaches that can be relieved by medicines. The medicines should be approved by your health care provider.  You may urinate more often because the fetus is pressing on your bladder.  You may develop or continue to have heartburn as a result of your pregnancy.  You may develop constipation because certain hormones are causing the muscles that push waste through your intestines to slow down.  You may develop hemorrhoids or swollen, bulging veins (varicose veins).  You may have back pain. This is caused by: ? Weight gain. ? Pregnancy hormones that are relaxing the joints in your pelvis. ? A shift in weight and the muscles that support your balance.  Your breasts will continue to grow and they will continue to become tender.  Your gums may bleed and  may be sensitive to brushing and flossing.  Dark spots or blotches (chloasma, mask of pregnancy) may develop on your face. This will likely fade after the baby is born.  A dark line from your belly button to the pubic area (linea nigra) may appear. This will likely fade after the baby is born.  You may have changes in your hair. These can include thickening of your hair, rapid growth, and changes in texture. Some women also have hair loss during or after pregnancy, or hair that feels dry or thin. Your hair will most likely return to normal after your baby is born.  What to expect at prenatal visits During a routine prenatal visit:  You will be weighed to make sure you and the fetus are growing normally.  Your blood pressure will be taken.  Your abdomen will be measured to track your baby's growth.  The fetal heartbeat will be listened to.  Any test results from the previous visit will be discussed.  Your health care provider may ask you:  How you are feeling.  If you are feeling the baby move.  If you have had any abnormal symptoms, such as leaking fluid, bleeding, severe headaches, or abdominal cramping.  If you are using any tobacco products, including cigarettes, chewing tobacco, and electronic cigarettes.  If you have any questions.  Other tests that may be performed during your second trimester include:  Blood tests that check for: ? Low iron levels (anemia). ? High  blood sugar that affects pregnant women (gestational diabetes) between 3 and 28 weeks. ? Rh antibodies. This is to check for a protein on red blood cells (Rh factor).  Urine tests to check for infections, diabetes, or protein in the urine.  An ultrasound to confirm the proper growth and development of the baby.  An amniocentesis to check for possible genetic problems.  Fetal screens for spina bifida and Down syndrome.  HIV (human immunodeficiency virus) testing. Routine prenatal testing includes  screening for HIV, unless you choose not to have this test.  Follow these instructions at home: Medicines  Follow your health care provider's instructions regarding medicine use. Specific medicines may be either safe or unsafe to take during pregnancy.  Take a prenatal vitamin that contains at least 600 micrograms (mcg) of folic acid.  If you develop constipation, try taking a stool softener if your health care provider approves. Eating and drinking  Eat a balanced diet that includes fresh fruits and vegetables, whole grains, good sources of protein such as meat, eggs, or tofu, and low-fat dairy. Your health care provider will help you determine the amount of weight gain that is right for you.  Avoid raw meat and uncooked cheese. These carry germs that can cause birth defects in the baby.  If you have low calcium intake from food, talk to your health care provider about whether you should take a daily calcium supplement.  Limit foods that are high in fat and processed sugars, such as fried and sweet foods.  To prevent constipation: ? Drink enough fluid to keep your urine clear or pale yellow. ? Eat foods that are high in fiber, such as fresh fruits and vegetables, whole grains, and beans. Activity  Exercise only as directed by your health care provider. Most women can continue their usual exercise routine during pregnancy. Try to exercise for 30 minutes at least 5 days a week. Stop exercising if you experience uterine contractions.  Avoid heavy lifting, wear low heel shoes, and practice good posture.  A sexual relationship may be continued unless your health care provider directs you otherwise. Relieving pain and discomfort  Wear a good support bra to prevent discomfort from breast tenderness.  Take warm sitz baths to soothe any pain or discomfort caused by hemorrhoids. Use hemorrhoid cream if your health care provider approves.  Rest with your legs elevated if you have leg cramps  or low back pain.  If you develop varicose veins, wear support hose. Elevate your feet for 15 minutes, 3-4 times a day. Limit salt in your diet. Prenatal Care  Write down your questions. Take them to your prenatal visits.  Keep all your prenatal visits as told by your health care provider. This is important. Safety  Wear your seat belt at all times when driving.  Make a list of emergency phone numbers, including numbers for family, friends, the hospital, and police and fire departments. General instructions  Ask your health care provider for a referral to a local prenatal education class. Begin classes no later than the beginning of month 6 of your pregnancy.  Ask for help if you have counseling or nutritional needs during pregnancy. Your health care provider can offer advice or refer you to specialists for help with various needs.  Do not use hot tubs, steam rooms, or saunas.  Do not douche or use tampons or scented sanitary pads.  Do not cross your legs for long periods of time.  Avoid cat litter boxes and soil  used by cats. These carry germs that can cause birth defects in the baby and possibly loss of the fetus by miscarriage or stillbirth.  Avoid all smoking, herbs, alcohol, and unprescribed drugs. Chemicals in these products can affect the formation and growth of the baby.  Do not use any products that contain nicotine or tobacco, such as cigarettes and e-cigarettes. If you need help quitting, ask your health care provider.  Visit your dentist if you have not gone yet during your pregnancy. Use a soft toothbrush to brush your teeth and be gentle when you floss. Contact a health care provider if:  You have dizziness.  You have mild pelvic cramps, pelvic pressure, or nagging pain in the abdominal area.  You have persistent nausea, vomiting, or diarrhea.  You have a bad smelling vaginal discharge.  You have pain when you urinate. Get help right away if:  You have a  fever.  You are leaking fluid from your vagina.  You have spotting or bleeding from your vagina.  You have severe abdominal cramping or pain.  You have rapid weight gain or weight loss.  You have shortness of breath with chest pain.  You notice sudden or extreme swelling of your face, hands, ankles, feet, or legs.  You have not felt your baby move in over an hour.  You have severe headaches that do not go away when you take medicine.  You have vision changes. Summary  The second trimester is from week 14 through week 27 (months 4 through 6). It is also a time when the fetus is growing rapidly.  Your body goes through many changes during pregnancy. The changes vary from woman to woman.  Avoid all smoking, herbs, alcohol, and unprescribed drugs. These chemicals affect the formation and growth your baby.  Do not use any tobacco products, such as cigarettes, chewing tobacco, and e-cigarettes. If you need help quitting, ask your health care provider.  Contact your health care provider if you have any questions. Keep all prenatal visits as told by your health care provider. This is important. This information is not intended to replace advice given to you by your health care provider. Make sure you discuss any questions you have with your health care provider. Document Released: 10/05/2001 Document Revised: 03/18/2016 Document Reviewed: 12/12/2012 Elsevier Interactive Patient Education  2017 [employer].

## 2017-08-22 NOTE — Progress Notes (Signed)
   HIGH-RISK PREGNANCY VISIT Patient name: Christie Morrow Fleener MRN [id number]  Date of birth: 31-Jan-1981 Chief Complaint:   Routine Prenatal Visit  History of Present Illness:   Christie Morrow Hoecker is a 36 y.o. 343P1011 female at 4637w6d with an Estimated Date of Delivery: 02/28/18 being seen today for ongoing management of a high-risk pregnancy complicated by CHTN-no meds.  Today she reports some cramping/pressure- states urine is dark, only drinks Dr. Reino KentPepper, no water. Is almost finished w/ keflex for ASB. Contractions: Not present. Vag. Bleeding: None.  Movement: Absent. denies leaking of fluid.  Review of Systems:   Pertinent items are noted in HPI Denies abnormal vaginal discharge w/ itching/odor/irritation, headaches, visual changes, shortness of breath, chest pain, abdominal pain, severe nausea/vomiting, or problems with urination or bowel movements unless otherwise stated above. Pertinent History Reviewed:  Reviewed past medical,surgical, social, obstetrical and family history.  Reviewed problem list, medications and allergies. Physical Assessment:   Vitals:   08/22/17 0904  BP: 110/60  Pulse: 84  Weight: 280 lb (127 kg)  Body mass index is 43.85 kg/m.           Physical Examination:   General appearance: alert, well appearing, and in no distress  Mental status: alert, oriented to person, place, and time  Skin: warm & dry   Extremities: Edema: None    Cardiovascular: normal heart rate noted  Respiratory: normal respiratory effort, no distress  Abdomen: gravid, soft, non-tender  Pelvic: Cervical exam deferred         Fetal Status: Fetal Heart Rate (bpm): 179 us   Movement: Absent   Fetal Surveillance Testing today:  US 12+6 wks,measurement c/w dates,normal ovaries bilat,fhr 179 bpm,crl 64.01 mm,ant pl gr 0,NB present,NT 1.7 mm   Results for orders placed or performed in visit on 08/22/17 (from the past 24 hour(s))  POCT Urinalysis Dipstick   Collection Time: 08/22/17  9:08 AM  Result  Value Ref Range   Color, UA     Clarity, UA     Glucose, UA neg    Bilirubin, UA     Ketones, UA neg    Spec Grav, UA  1.010 - 1.025   Blood, UA neg    pH, UA  5.0 - 8.0   Protein, UA neg    Urobilinogen, UA  0.2 or 1.0 E.U./dL   Nitrite, UA neg    Leukocytes, UA Negative Negative    Assessment & Plan:  1) High-risk pregnancy G3P1011 at 5437w6d with an Estimated Date of Delivery: 02/28/18   2) CHTN-no meds, stable, continue baby asa daily  3) ASB first trimester, finishing up keflex> send urine cx poc today  Labs/procedures today: 1st IT/NT  Treatment Plan:   Growth u/s @ 20, 28, 34, 38wks     2x/wk testing nst/sono @ 32wks    Deliver @ 40wks (no meds), 39wks (meds)  Reviewed: Preterm labor symptoms and general obstetric precautions including but not limited to vaginal bleeding, contractions, leaking of fluid and fetal movement were reviewed in detail with the patient.  All questions were answered.  Follow-up: Return in about 3 weeks (around 09/12/2017) for HROB.  Orders Placed This Encounter  Procedures  . Urine Culture  . Integrated 1  . POCT Urinalysis Dipstick   Marge DuncansBooker, Franceska Strahm Randall CNM, [facility]NP-BC 08/22/2017 9:27 AM

## 2017-08-24 LAB — INTEGRATED 1
Crown Rump Length: 64 mm
GEST. AGE ON COLLECTION DATE: 12.7 wk
Maternal Age at EDD: 34.4 yr
NUCHAL TRANSLUCENCY (NT): 1.7 mm
Number of Fetuses: 1
PAPP-A VALUE: 306.3 ng/mL
WEIGHT: 280 [lb_av]

## 2017-08-25 LAB — URINE CULTURE: Organism ID, Bacteria: NO GROWTH

## 2017-09-08 ENCOUNTER — Telehealth: Payer: Self-pay | Admitting: *Deleted

## 2017-09-08 NOTE — Telephone Encounter (Signed)
Spoke with pt. Pt was bit by a 809 month old puppy last pm. Skin was broke. Puppy is caught up on shots. I advised to watch it. If she notices any signs of infection like redness, warm to touch, or fever, let us know. Pt voiced understanding. JSY

## 2017-09-08 NOTE — Telephone Encounter (Signed)
Left message x 2. JSY 

## 2017-09-08 NOTE — Telephone Encounter (Signed)
Left message x 1. JSY 

## 2017-09-12 ENCOUNTER — Ambulatory Visit (INDEPENDENT_AMBULATORY_CARE_PROVIDER_SITE_OTHER): Payer: Medicaid Other | Admitting: Women's Health

## 2017-09-12 ENCOUNTER — Other Ambulatory Visit: Payer: Self-pay

## 2017-09-12 ENCOUNTER — Encounter: Payer: Self-pay | Admitting: Women's Health

## 2017-09-12 VITALS — BP 124/72 | HR 105 | Wt 277.0 lb

## 2017-09-12 DIAGNOSIS — Z3A15 15 weeks gestation of pregnancy: Secondary | ICD-10-CM

## 2017-09-12 DIAGNOSIS — Z1389 Encounter for screening for other disorder: Secondary | ICD-10-CM | POA: Diagnosis not present

## 2017-09-12 DIAGNOSIS — Z331 Pregnant state, incidental: Secondary | ICD-10-CM | POA: Diagnosis not present

## 2017-09-12 DIAGNOSIS — O10912 Unspecified pre-existing hypertension complicating pregnancy, second trimester: Secondary | ICD-10-CM

## 2017-09-12 DIAGNOSIS — Z363 Encounter for antenatal screening for malformations: Secondary | ICD-10-CM | POA: Diagnosis not present

## 2017-09-12 DIAGNOSIS — O0992 Supervision of high risk pregnancy, unspecified, second trimester: Secondary | ICD-10-CM

## 2017-09-12 DIAGNOSIS — Z1379 Encounter for other screening for genetic and chromosomal anomalies: Secondary | ICD-10-CM | POA: Diagnosis not present

## 2017-09-12 LAB — POCT URINALYSIS DIPSTICK
GLUCOSE UA: NEGATIVE
Leukocytes, UA: NEGATIVE
Nitrite, UA: NEGATIVE
Protein, UA: NEGATIVE
RBC UA: NEGATIVE

## 2017-09-12 NOTE — Patient Instructions (Signed)
secondary to

## 2017-09-12 NOTE — Progress Notes (Signed)
   HIGH-RISK PREGNANCY VISIT Patient name: Christie Morrow MRN [id number]  Date of birth: Nov 13, 1980 Chief Complaint:   High Risk Gestation (2nd IT)  History of Present Illness:   Christie Morrow is a 36 y.o. 663P1011 female at 7478w6d with an Estimated Date of Delivery: 02/28/18 being seen today for ongoing management of a high-risk pregnancy complicated by [facility].  Today she reports got bit by dog the other day on her finger- dog is up to date on vaccines/rabies shot, healing well. [facility] [employer]!  Contractions: Not present. Vag. Bleeding: None.  Movement: Present. denies leaking of fluid.  Review of Systems:   Pertinent items are noted in HPI Denies abnormal vaginal discharge w/ itching/odor/irritation, headaches, visual changes, shortness of breath, chest pain, abdominal pain, severe nausea/vomiting, or problems with urination or bowel movements unless otherwise stated above. Pertinent History Reviewed:  Reviewed past medical,surgical, social, obstetrical and family history.  Reviewed problem list, medications and allergies. Physical Assessment:   Vitals:   09/12/17 1510  BP: 124/72  Pulse: (!) 105  Weight: 277 lb (125.6 kg)  Body mass index is 43.38 kg/m.           Physical Examination:   General appearance: alert, well appearing, and in no distress  Mental status: alert, oriented to person, place, and time  Skin: warm & dry   Extremities: Edema: None    Cardiovascular: normal heart rate noted  Respiratory: normal respiratory effort, no distress  Abdomen: gravid, soft, non-tender  Pelvic: Cervical exam deferred         Fetal Status: Fetal Heart Rate (bpm): 160   Movement: Present    Fetal Surveillance Testing today: FHT doppler   Results for orders placed or performed in visit on 09/12/17 (from the past 24 hour(s))  POCT urinalysis dipstick   Collection Time: 09/12/17  3:16 PM  Result Value Ref Range   Color, UA     Clarity, UA     Glucose, UA neg    Bilirubin, UA     Ketones, UA small    Spec Grav, UA  1.010 - 1.025   Blood, UA neg    pH, UA  5.0 - 8.0   Protein, UA neg    Urobilinogen, UA  0.2 or 1.0 E.U./dL   Nitrite, UA neg    Leukocytes, UA Negative Negative    Assessment & Plan:  1) High-risk pregnancy G3P1011 at 5778w6d with an Estimated Date of Delivery: 02/28/18   2) CHTN-no meds, stable, continue baby ASA  Labs/procedures today: will send P:C ratio, as 24hr urine elevated earlier- but had UTI at time, last urine cx neg  Treatment Plan:   Growth u/s @ 20, 28, 34, 38wks     2x/wk testing nst/sono @ 32wks or weekly BPP   Deliver @ 40wks (no meds), 39wks (meds)  Reviewed: Preterm labor symptoms and general obstetric precautions including but not limited to vaginal bleeding, contractions, leaking of fluid and fetal movement were reviewed in detail with the patient.  All questions were answered.  Follow-up: Return in about 2 weeks (around 09/27/2017) for HROB, WU:[id number]:Anatomy.  Orders Placed This Encounter  Procedures  . US OB Comp + 14 Wk  . INTEGRATED 2  . Protein / creatinine ratio, urine  . POCT urinalysis dipstick   Marge DuncansBooker, Marsena Taff Randall CNM, [facility]WHNP-BC 09/12/2017 3:36 PM

## 2017-09-13 LAB — PROTEIN / CREATININE RATIO, URINE
CREATININE, UR: 244.8 mg/dL
PROTEIN UR: 23.3 mg/dL
Protein/Creat Ratio: 95 mg/g creat (ref 0–200)

## 2017-09-14 LAB — INTEGRATED 2
ADSF: 0.57
AFP MARKER: 24.6 ng/mL
AFP MoM: 1.45
Crown Rump Length: 64 mm
DIA MOM: 1.48
DIA Value: 180.7 pg/mL
ESTRIOL UNCONJUGATED: 0.33 ng/mL
GESTATIONAL AGE: 15.7 wk
Gest. Age on Collection Date: 12.7 weeks
Maternal Age at EDD: 34.4 yr
NUCHAL TRANSLUCENCY MOM: 1.06
Nuchal Translucency (NT): 1.7 mm
Number of Fetuses: 1
PAPP-A MoM: 0.67
PAPP-A Value: 306.3 ng/mL
TEST RESULTS: NEGATIVE
Weight: 280 [lb_av]
Weight: 280 [lb_av]
hCG MoM: 1.64
hCG Value: 36.3 IU/mL

## 2017-09-23 ENCOUNTER — Encounter: Payer: Self-pay | Admitting: Family Medicine

## 2017-09-27 ENCOUNTER — Ambulatory Visit (INDEPENDENT_AMBULATORY_CARE_PROVIDER_SITE_OTHER): Payer: Medicaid Other

## 2017-09-27 ENCOUNTER — Ambulatory Visit (INDEPENDENT_AMBULATORY_CARE_PROVIDER_SITE_OTHER): Payer: Medicaid Other | Admitting: Advanced Practice Midwife

## 2017-09-27 VITALS — BP 134/68 | HR 97 | Wt 279.0 lb

## 2017-09-27 DIAGNOSIS — Z1389 Encounter for screening for other disorder: Secondary | ICD-10-CM

## 2017-09-27 DIAGNOSIS — O0990 Supervision of high risk pregnancy, unspecified, unspecified trimester: Secondary | ICD-10-CM

## 2017-09-27 DIAGNOSIS — O10912 Unspecified pre-existing hypertension complicating pregnancy, second trimester: Secondary | ICD-10-CM

## 2017-09-27 DIAGNOSIS — Z3A18 18 weeks gestation of pregnancy: Secondary | ICD-10-CM | POA: Diagnosis not present

## 2017-09-27 DIAGNOSIS — Z363 Encounter for antenatal screening for malformations: Secondary | ICD-10-CM | POA: Diagnosis not present

## 2017-09-27 DIAGNOSIS — Z331 Pregnant state, incidental: Secondary | ICD-10-CM | POA: Diagnosis not present

## 2017-09-27 DIAGNOSIS — O0993 Supervision of high risk pregnancy, unspecified, third trimester: Secondary | ICD-10-CM

## 2017-09-27 LAB — POCT URINALYSIS DIPSTICK
Blood, UA: NEGATIVE
Glucose, UA: NEGATIVE
Ketones, UA: NEGATIVE
LEUKOCYTES UA: NEGATIVE
NITRITE UA: NEGATIVE
PROTEIN UA: NEGATIVE

## 2017-09-27 NOTE — Progress Notes (Signed)
HIGH-RISK PREGNANCY VISIT Patient name: Christie Morrow Ourada MRN [id number]  Date of birth: [birth year] Chief Complaint:   Routine Prenatal Visit (ultrasound today)  History of Present Illness:   Christie Morrow Mooty is a 36 y.o. 283P1011 female at 2936w0d with an Estimated Date of Delivery: 02/28/18 being seen today for ongoing management of a high-risk pregnancy complicated by chronic HTN.  Today she reports no complaints. Contractions: Not present. Vag. Bleeding: None.  Movement: Present. denies leaking of fluid.  Review of Systems:   Pertinent items are noted in HPI Denies abnormal vaginal discharge w/ itching/odor/irritation, headaches, visual changes, shortness of breath, chest pain, abdominal pain, severe nausea/vomiting, or problems with urination or bowel movements unless otherwise stated above.    Pertinent History Reviewed:  Medical & Surgical Hx:   Past Medical History:  Diagnosis Date  . Arthritis   . Disc disorder   . Fibromyalgia   . Mental disorder    depression/anxiety  . Vaginal Pap smear, abnormal    Past Surgical History:  Procedure Laterality Date  . CRYOTHERAPY    . FLEXIBLE SIGMOIDOSCOPY  06/11/2011   Procedure: FLEXIBLE SIGMOIDOSCOPY;  Surgeon: Malissa HippoNajeeb U Rehman, MD;  Location: [facility];  Service: Endoscopy;  Laterality: N/A;  3:30  . WISDOM TOOTH EXTRACTION  2003   Family History  Problem Relation Age of Onset  . Heart disease Father   . Multiple sclerosis Father   . Stroke Father   . Cancer Maternal Grandmother        breast  . Heart disease Maternal Grandmother   . Heart disease Maternal Grandfather   . Heart disease Paternal Grandmother   . Heart disease Paternal Grandfather     Current Outpatient Medications:  .  BABY ASPIRIN PO, Take by mouth daily., Disp: , Rfl:  .  buPROPion (WELLBUTRIN XL) 300 MG 24 hr tablet, Take 1 tablet (300 mg total) by mouth daily., Disp: 30 tablet, Rfl: 2 .  DULoxetine (CYMBALTA) 60 MG capsule, Take 1 capsule (60 mg total) by mouth  daily., Disp: 30 capsule, Rfl: 2 .  Prenatal Vit-Fe Fumarate-FA (MULTIVITAMIN-PRENATAL) 27-0.8 MG TABS tablet, Take 1 tablet by mouth daily at 12 noon., Disp: , Rfl:  .  valACYclovir (VALTREX) 500 MG tablet, Take 1 tablet (500 mg total) by mouth 2 (two) times daily., Disp: 60 tablet, Rfl: 6 .  albuterol (PROVENTIL HFA;VENTOLIN HFA) 108 (90 Base) MCG/ACT inhaler, Inhale 2 puffs into the lungs every 4 (four) hours as needed for wheezing or shortness of breath. (Patient not taking: Reported on 08/22/2017), Disp: 1 Inhaler, Rfl: 0 Social History: Reviewed -  reports that  has never smoked. she has never used smokeless tobacco.   Physical Assessment:   Vitals:   09/27/17 1536  BP: 134/68  Pulse: 97  Weight: 279 lb (126.6 kg)  Body mass index is 43.7 kg/m.           Physical Examination:   General appearance: alert, well appearing, and in no distress  Mental status: alert, oriented to person, place, and time  Skin: warm & dry   Extremities: Edema: None    Cardiovascular: normal heart rate noted  Respiratory: normal respiratory effort, no distress  Abdomen: gravid, soft, non-tender  Pelvic: Cervical exam deferred         Fetal Status:     Movement: Present    Fetal Surveillance Testing today: US  Results for orders placed or performed in visit on 09/27/17 (from the past 24 hour(s))  POCT Urinalysis  Dipstick   Collection Time: 09/27/17  3:40 PM  Result Value Ref Range   Color, UA     Clarity, UA     Glucose, UA neg    Bilirubin, UA     Ketones, UA neg    Spec Grav, UA  1.010 - 1.025   Blood, UA neg    pH, UA  5.0 - 8.0   Protein, UA neg    Urobilinogen, UA  0.2 or 1.0 E.U./dL   Nitrite, UA neg    Leukocytes, UA Negative Negative    Assessment & Plan:  1) High-risk pregnancy G3P1011 at 102w0d with an Estimated Date of Delivery: 02/28/18   2) , CHTN, no meds, stable  3) ,   Labs/procedures today: US 18 wks,cephalic,cx 4.5 cm,ant pl gr 0,normal ovaries bilat,svp of fluid 3.9  cm,right choroid plexus cyst 4.7 x 2.7 mm,fhr 154 bpm,efw 213 Morrow,anatomy complete    Medications: ASA 81mg   Treatment Plan: Growth u/s 28, 34, 38wks     2x/wk testing nst/sono @ 32wks or weekly BPP   Deliver @ 40wks (no meds), 39wks (meds):______   Follow-up: Return in about 4 weeks (around 10/25/2017) for LROB.  Orders Placed This Encounter  Procedures  . POCT Urinalysis Dipstick   CRESENZO-DISHMAN,Jameison Haji CNM 09/27/2017 4:02 PM

## 2017-09-27 NOTE — Progress Notes (Signed)
US 18 wks,cephalic,cx 4.5 cm,ant pl gr 0,normal ovaries bilat,svp of fluid 3.9 cm,right choroid plexus cyst 4.7 x 2.7 mm,fhr 154 bpm,efw 213 g,anatomy complete

## 2017-10-25 NOTE — L&D Delivery Note (Signed)
Delivery Note At 1:29 PM a viable and healthy female was delivered via Vaginal, Spontaneous (Presentation: cephalic; LOA  ).  APGAR: 9,9    Placenta status: spontaneous, intact .  Cord: 3 vessel with no complications  Anesthesia:  epidural Episiotomy: None Lacerations:  First degree vaginal Suture Repair: 3.0 monocryl Est. Blood Loss (mL):    Mom to postpartum.  Baby to Couplet care / Skin to Skin.  Christie Morrow 02/03/2018, 2:06 PM

## 2017-10-26 ENCOUNTER — Encounter: Payer: Medicaid Other | Admitting: Obstetrics and Gynecology

## 2017-11-03 ENCOUNTER — Ambulatory Visit (INDEPENDENT_AMBULATORY_CARE_PROVIDER_SITE_OTHER): Payer: Medicaid Other | Admitting: Advanced Practice Midwife

## 2017-11-03 ENCOUNTER — Encounter: Payer: Self-pay | Admitting: Advanced Practice Midwife

## 2017-11-03 ENCOUNTER — Other Ambulatory Visit: Payer: Self-pay

## 2017-11-03 VITALS — BP 132/78 | HR 107 | Wt 284.0 lb

## 2017-11-03 DIAGNOSIS — O10919 Unspecified pre-existing hypertension complicating pregnancy, unspecified trimester: Secondary | ICD-10-CM

## 2017-11-03 DIAGNOSIS — O10912 Unspecified pre-existing hypertension complicating pregnancy, second trimester: Secondary | ICD-10-CM | POA: Diagnosis not present

## 2017-11-03 DIAGNOSIS — Z331 Pregnant state, incidental: Secondary | ICD-10-CM

## 2017-11-03 DIAGNOSIS — Z1389 Encounter for screening for other disorder: Secondary | ICD-10-CM

## 2017-11-03 DIAGNOSIS — O0992 Supervision of high risk pregnancy, unspecified, second trimester: Secondary | ICD-10-CM

## 2017-11-03 DIAGNOSIS — Z3A23 23 weeks gestation of pregnancy: Secondary | ICD-10-CM

## 2017-11-03 LAB — POCT URINALYSIS DIPSTICK
Blood, UA: NEGATIVE
GLUCOSE UA: NEGATIVE
KETONES UA: NEGATIVE
LEUKOCYTES UA: NEGATIVE
Nitrite, UA: NEGATIVE
Protein, UA: NEGATIVE

## 2017-11-03 MED ORDER — OMEPRAZOLE 20 MG PO CPDR: 20.0000 mg | DELAYED_RELEASE_CAPSULE | Freq: Every day | ORAL | 6 refills | Status: DC

## 2017-11-03 NOTE — Progress Notes (Signed)
HIGH-RISK PREGNANCY VISIT Patient name: Christie Morrow MRN [id number]  Date of birth: July 17, 1981 Chief Complaint:   High Risk Gestation  History of Present Illness:   Christie Morrow is a 37 y.o. G32P1011 female at [redacted]w[redacted]d with an Estimated Date of Delivery: 02/28/18 being seen today for ongoing management of a high-risk pregnancy complicated by chronic HTN.  Today she reports heartburn. Contractions: Not present. Vag. Bleeding: None.  Movement: Present. denies leaking of fluid.  Review of Systems:   Pertinent items are noted in HPI Denies abnormal vaginal discharge w/ itching/odor/irritation, headaches, visual changes, shortness of breath, chest pain, abdominal pain, severe nausea/vomiting, or problems with urination or bowel movements unless otherwise stated above.    Pertinent History Reviewed:  Medical & Surgical Hx:   Past Medical History:  Diagnosis Date  . Arthritis   . Disc disorder   . Fibromyalgia   . Mental disorder    depression/anxiety  . Vaginal Pap smear, abnormal    Past Surgical History:  Procedure Laterality Date  . CRYOTHERAPY    . FLEXIBLE SIGMOIDOSCOPY  06/11/2011   Procedure: FLEXIBLE SIGMOIDOSCOPY;  Surgeon: Malissa Hippo, MD;  Location: [facility];  Service: Endoscopy;  Laterality: N/A;  3:30  . WISDOM TOOTH EXTRACTION  2003   Family History  Problem Relation Age of Onset  . Heart disease Father   . Multiple sclerosis Father   . Stroke Father   . Cancer Maternal Grandmother        breast  . Heart disease Maternal Grandmother   . Heart disease Maternal Grandfather   . Heart disease Paternal Grandmother   . Heart disease Paternal Grandfather     Current Outpatient Medications:  .  albuterol (PROVENTIL HFA;VENTOLIN HFA) 108 (90 Base) MCG/ACT inhaler, Inhale 2 puffs into the lungs every 4 (four) hours as needed for wheezing or shortness of breath., Disp: 1 Inhaler, Rfl: 0 .  BABY ASPIRIN PO, Take by mouth daily., Disp: , Rfl:  .  buPROPion (WELLBUTRIN XL)  300 MG 24 hr tablet, Take 1 tablet (300 mg total) by mouth daily., Disp: 30 tablet, Rfl: 2 .  DULoxetine (CYMBALTA) 60 MG capsule, Take 1 capsule (60 mg total) by mouth daily., Disp: 30 capsule, Rfl: 2 .  Prenatal Vit-Fe Fumarate-FA (MULTIVITAMIN-PRENATAL) 27-0.8 MG TABS tablet, Take 1 tablet by mouth daily at 12 noon., Disp: , Rfl:  .  valACYclovir (VALTREX) 500 MG tablet, Take 1 tablet (500 mg total) by mouth 2 (two) times daily., Disp: 60 tablet, Rfl: 6 .  omeprazole (PRILOSEC) 20 MG capsule, Take 1 capsule (20 mg total) by mouth daily., Disp: 30 capsule, Rfl: 6 Social History: Reviewed -  reports that  has never smoked. she has never used smokeless tobacco.   Physical Assessment:   Vitals:   11/03/17 1522  BP: 132/78  Pulse: (!) 107  Weight: 284 lb (128.8 kg)  Body mass index is 44.48 kg/m.           Physical Examination:   General appearance: alert, well appearing, and in no distress  Mental status: alert, oriented to person, place, and time  Skin: warm & dry   Extremities: Edema: None    Cardiovascular: normal heart rate noted  Respiratory: normal respiratory effort, no distress  Abdomen: gravid, soft, non-tender  Pelvic: Cervical exam deferred         Fetal Status:     Movement: Present    Fetal Surveillance Testing today: doppler  Results for orders placed or  performed in visit on 11/03/17 (from the past 24 hour(s))  POCT urinalysis dipstick   Collection Time: 11/03/17  3:24 PM  Result Value Ref Range   Color, UA     Clarity, UA     Glucose, UA neg    Bilirubin, UA     Ketones, UA neg    Spec Grav, UA  1.010 - 1.025   Blood, UA neg    pH, UA  5.0 - 8.0   Protein, UA neg    Urobilinogen, UA  0.2 or 1.0 E.U./dL   Nitrite, UA neg    Leukocytes, UA Negative Negative   Appearance     Odor      Assessment & Plan:  1) High-risk pregnancy G3P1011 at 3436w2d with an Estimated Date of Delivery: 02/28/18   2) CHTN, no meds, stable  3) ,   Labs/procedures today:    Medications: ASA 81mg   Treatment Plan:  Growth u/s 28, 34, 38wks     2x/wk testing nst/sono @ 32wks or weekly BPP   Deliver @ 40wks (no meds), 39wks (meds):______  Follow-up: Return in about 4 weeks (around 12/01/2017) for HROB, PN2, US:EFW.  Orders Placed This Encounter  Procedures  . US OB Follow Up  . POCT urinalysis dipstick   CRESENZO-DISHMAN,Rivaldo Hineman CNM 11/03/2017 3:49 PM

## 2017-11-03 NOTE — Patient Instructions (Signed)

## 2017-11-14 ENCOUNTER — Other Ambulatory Visit: Payer: Self-pay | Admitting: Nurse Practitioner

## 2017-11-14 ENCOUNTER — Telehealth: Payer: Self-pay | Admitting: *Deleted

## 2017-11-14 NOTE — Telephone Encounter (Signed)
Patient called requesting refill for Cymbalta and Wellbutrin. She states that Sherie Donarolyn Hoskins, NP at [facility] has prescribed it to her in the past but she has been dismissed from the practice.  Please advise.

## 2017-11-15 MED ORDER — BUPROPION HCL ER (XL) 300 MG PO TB24: 300.0000 mg | ORAL_TABLET | Freq: Every day | ORAL | 6 refills | Status: DC

## 2017-11-15 MED ORDER — DULOXETINE HCL 60 MG PO CPEP: 60.0000 mg | ORAL_CAPSULE | Freq: Every day | ORAL | 6 refills | Status: DC

## 2017-11-15 NOTE — Telephone Encounter (Signed)
Patient notified that prescriptions requested were sent to pharmacy.

## 2017-12-01 ENCOUNTER — Ambulatory Visit (INDEPENDENT_AMBULATORY_CARE_PROVIDER_SITE_OTHER): Payer: Medicaid Other

## 2017-12-01 ENCOUNTER — Other Ambulatory Visit: Payer: Medicaid Other

## 2017-12-01 ENCOUNTER — Encounter: Payer: Self-pay | Admitting: Women's Health

## 2017-12-01 ENCOUNTER — Ambulatory Visit (INDEPENDENT_AMBULATORY_CARE_PROVIDER_SITE_OTHER): Payer: Medicaid Other | Admitting: Women's Health

## 2017-12-01 VITALS — BP 118/70 | HR 96 | Wt 288.4 lb

## 2017-12-01 DIAGNOSIS — Z331 Pregnant state, incidental: Secondary | ICD-10-CM | POA: Diagnosis not present

## 2017-12-01 DIAGNOSIS — O2692 Pregnancy related conditions, unspecified, second trimester: Secondary | ICD-10-CM | POA: Diagnosis not present

## 2017-12-01 DIAGNOSIS — G930 Cerebral cysts: Secondary | ICD-10-CM

## 2017-12-01 DIAGNOSIS — G8929 Other chronic pain: Secondary | ICD-10-CM | POA: Diagnosis not present

## 2017-12-01 DIAGNOSIS — O99322 Drug use complicating pregnancy, second trimester: Secondary | ICD-10-CM

## 2017-12-01 DIAGNOSIS — M549 Dorsalgia, unspecified: Secondary | ICD-10-CM | POA: Diagnosis not present

## 2017-12-01 DIAGNOSIS — Z3A27 27 weeks gestation of pregnancy: Secondary | ICD-10-CM | POA: Diagnosis not present

## 2017-12-01 DIAGNOSIS — O10919 Unspecified pre-existing hypertension complicating pregnancy, unspecified trimester: Secondary | ICD-10-CM

## 2017-12-01 DIAGNOSIS — O0992 Supervision of high risk pregnancy, unspecified, second trimester: Secondary | ICD-10-CM

## 2017-12-01 DIAGNOSIS — Z131 Encounter for screening for diabetes mellitus: Secondary | ICD-10-CM

## 2017-12-01 DIAGNOSIS — F1290 Cannabis use, unspecified, uncomplicated: Secondary | ICD-10-CM

## 2017-12-01 DIAGNOSIS — O10912 Unspecified pre-existing hypertension complicating pregnancy, second trimester: Secondary | ICD-10-CM

## 2017-12-01 DIAGNOSIS — O0990 Supervision of high risk pregnancy, unspecified, unspecified trimester: Secondary | ICD-10-CM

## 2017-12-01 DIAGNOSIS — Z1389 Encounter for screening for other disorder: Secondary | ICD-10-CM

## 2017-12-01 DIAGNOSIS — O9989 Other specified diseases and conditions complicating pregnancy, childbirth and the puerperium: Secondary | ICD-10-CM | POA: Diagnosis not present

## 2017-12-01 LAB — POCT URINALYSIS DIPSTICK
Blood, UA: NEGATIVE
GLUCOSE UA: NEGATIVE
Ketones, UA: NEGATIVE
LEUKOCYTES UA: NEGATIVE
Nitrite, UA: NEGATIVE

## 2017-12-01 MED ORDER — CYCLOBENZAPRINE HCL 10 MG PO TABS: 10.0000 mg | ORAL_TABLET | Freq: Three times a day (TID) | ORAL | 1 refills | Status: DC | PRN

## 2017-12-01 NOTE — Progress Notes (Addendum)
HIGH-RISK PREGNANCY VISIT Patient name: Christie Morrow MRN [id number]  Date of birth: 1980/11/11 Chief Complaint:   High Risk Gestation (PN2/ ultrasound)  History of Present Illness:   Christie Morrow is a 37 y.o. G53P1011 female at [redacted]w[redacted]d with an Estimated Date of Delivery: 02/28/18 being seen today for ongoing management of a high-risk pregnancy complicated by [facility]- no meds.  Today she reports fullness under Rt nipple x few weeks. Requests refill for flexeril. Has chronic back pain, is getting hard to sleep at night d/t shooting pains.  Contractions: Not present.  .  Movement: Present. denies leaking of fluid.  Review of Systems:   Pertinent items are noted in HPI Denies abnormal vaginal discharge w/ itching/odor/irritation, headaches, visual changes, shortness of breath, chest pain, abdominal pain, severe nausea/vomiting, or problems with urination or bowel movements unless otherwise stated above. Pertinent History Reviewed:  Reviewed past medical,surgical, social, obstetrical and family history.  Reviewed problem list, medications and allergies. Physical Assessment:   Vitals:   12/01/17 0936  BP: 118/70  Pulse: 96  Weight: 288 lb 6.4 oz (130.8 kg)  Body mass index is 45.17 kg/m.           Physical Examination:   General appearance: alert, well appearing, and in no distress  Mental status: alert, oriented to person, place, and time  Skin: warm & dry   Extremities: Edema: None   Breasts: no masses felt, has a lot of montgomery tubules under Rt nipple, slightly tender to touch   Cardiovascular: normal heart rate noted  Respiratory: normal respiratory effort, no distress  Abdomen: gravid, soft, non-tender  Pelvic: Cervical exam deferred         Fetal Status: Fetal Heart Rate (bpm): 146 u/s Fundal Height: 29 cm Movement: Present    Fetal Surveillance Testing today: [place] 27+2 wks,cephalic,cx 5.6 cm,anterior pl gr 0,normal ovaries bilat,afi 14 cm,resolved choroid plexus cyst,fhr 146  bpm,efw 1018 g 47%   Results for orders placed or performed in visit on 12/01/17 (from the past 24 hour(s))  POCT urinalysis dipstick   Collection Time: 12/01/17  9:42 AM  Result Value Ref Range   Color, UA     Clarity, UA     Glucose, UA neg    Bilirubin, UA     Ketones, UA neg    Spec Grav, UA  1.010 - 1.025   Blood, UA neg    pH, UA  5.0 - 8.0   Protein, UA trace    Urobilinogen, UA  0.2 or 1.0 E.U./dL   Nitrite, UA neg    Leukocytes, UA Negative Negative   Appearance     Odor      Assessment & Plan:  1) High-risk pregnancy G3P1011 at [redacted]w[redacted]d with an Estimated Date of Delivery: 02/28/18   2) CHTN-no meds, stable, continue baby asa  3) Chronic back pain> rx flexeril per request  Meds:  Meds ordered this encounter  Medications  . cyclobenzaprine (FLEXERIL) 10 MG tablet    Sig: Take 1 tablet (10 mg total) by mouth every 8 (eight) hours as needed for muscle spasms.    Dispense:  30 tablet    Refill:  1    Order Specific Question:   Supervising Provider    Answer:   Duane Lope H [[id number]]    Labs/procedures today: pn2, u/s  Treatment Plan:   Growth u/s @  34, 38wks     2x/wk testing nst/sono @ 32wks or weekly BPP   Deliver @ 40wks (  no meds), 39wks (meds)  Reviewed: Preterm labor symptoms and general obstetric precautions including but not limited to vaginal bleeding, contractions, leaking of fluid and fetal movement were reviewed in detail with the patient.  Recommended Tdap at HD/PCP per CDC guidelines. All questions were answered.  Follow-up: Return in about 3 weeks (around 12/22/2017) for HROB.  Orders Placed This Encounter  Procedures  . POCT urinalysis dipstick   Marge DuncansBooker, Aleyah Balik Randall CNM, [facility] 12/01/2017 10:11 AM

## 2017-12-01 NOTE — Progress Notes (Signed)
US 27+2 wks,cephalic,cx 5.6 cm,anterior pl gr 0,normal ovaries bilat,afi 14 cm,resolved choroid plexus cyst,fhr 146 bpm,efw 1018 g 47%

## 2017-12-01 NOTE — Patient Instructions (Addendum)
Christie Morrow, I greatly value your feedback.  If you receive a survey following your visit with us today, we appreciate you taking the time to fill it out.  Thanks, Joellyn HaffKim Ulrick Methot, CNM, WHNP-BC   Call the office [phone]) or go to [facility] if:  You begin to have strong, frequent contractions  Your water breaks.  Sometimes it is a big gush of fluid, sometimes it is just a trickle that keeps getting your panties wet or running down your legs  You have vaginal bleeding.  It is normal to have a small amount of spotting if your cervix was checked.   You don't feel your baby moving like normal.  If you don't, get you something to eat and drink and lay down and focus on feeling your baby move.  You should feel at least 10 movements in 2 hours.  If you don't, you should call the office or go to [facility].    Tdap Vaccine  It is recommended that you get the Tdap vaccine during the third trimester of EACH pregnancy to help protect your baby from getting pertussis (whooping cough)  27-36 weeks is the BEST time to do this so that you can pass the protection on to your baby. During pregnancy is better than after pregnancy, but if you are unable to get it during pregnancy it will be offered at the hospital.   You can get this vaccine at the health department or your family doctor  Everyone who will be around your baby should also be up-to-date on their vaccines. Adults (who are not pregnant) only need 1 dose of Tdap during adulthood.   Third Trimester of Pregnancy The third trimester is from week 29 through week 42, months 7 through 9. The third trimester is a time when the fetus is growing rapidly. At the end of the ninth month, the fetus is about 20 inches in length and weighs 6-10 pounds.  BODY CHANGES Your body goes through many changes during pregnancy. The changes vary from woman to woman.   Your weight will continue to increase. You can expect to gain 25-35 pounds (11-16 kg) by the  end of the pregnancy.  You may begin to get stretch marks on your hips, abdomen, and breasts.  You may urinate more often because the fetus is moving lower into your pelvis and pressing on your bladder.  You may develop or continue to have heartburn as a result of your pregnancy.  You may develop constipation because certain hormones are causing the muscles that push waste through your intestines to slow down.  You may develop hemorrhoids or swollen, bulging veins (varicose veins).  You may have pelvic pain because of the weight gain and pregnancy hormones relaxing your joints between the bones in your pelvis. Backaches may result from overexertion of the muscles supporting your posture.  You may have changes in your hair. These can include thickening of your hair, rapid growth, and changes in texture. Some women also have hair loss during or after pregnancy, or hair that feels dry or thin. Your hair will most likely return to normal after your baby is born.  Your breasts will continue to grow and be tender. A yellow discharge may leak from your breasts called colostrum.  Your belly button may stick out.  You may feel short of breath because of your expanding uterus.  You may notice the fetus "dropping," or moving lower in your abdomen.  You may have a bloody  mucus discharge. This usually occurs a few days to a week before labor begins.  Your cervix becomes thin and soft (effaced) near your due date. WHAT TO EXPECT AT YOUR PRENATAL EXAMS  You will have prenatal exams every 2 weeks until week 36. Then, you will have weekly prenatal exams. During a routine prenatal visit:  You will be weighed to make sure you and the fetus are growing normally.  Your blood pressure is taken.  Your abdomen will be measured to track your baby's growth.  The fetal heartbeat will be listened to.  Any test results from the previous visit will be discussed.  You may have a cervical check near your due  date to see if you have effaced. At around 36 weeks, your caregiver will check your cervix. At the same time, your caregiver will also perform a test on the secretions of the vaginal tissue. This test is to determine if a type of bacteria, Group B streptococcus, is present. Your caregiver will explain this further. Your caregiver may ask you:  What your birth plan is.  How you are feeling.  If you are feeling the baby move.  If you have had any abnormal symptoms, such as leaking fluid, bleeding, severe headaches, or abdominal cramping.  If you have any questions. Other tests or screenings that may be performed during your third trimester include:  Blood tests that check for low iron levels (anemia).  Fetal testing to check the health, activity level, and growth of the fetus. Testing is done if you have certain medical conditions or if there are problems during the pregnancy. FALSE LABOR You may feel small, irregular contractions that eventually go away. These are called Braxton Hicks contractions, or false labor. Contractions may last for hours, days, or even weeks before true labor sets in. If contractions come at regular intervals, intensify, or become painful, it is best to be seen by your caregiver.  SIGNS OF LABOR   Menstrual-like cramps.  Contractions that are 5 minutes apart or less.  Contractions that start on the top of the uterus and spread down to the lower abdomen and back.  A sense of increased pelvic pressure or back pain.  A watery or bloody mucus discharge that comes from the vagina. If you have any of these signs before the 37th week of pregnancy, call your caregiver right away. You need to go to the hospital to get checked immediately. HOME CARE INSTRUCTIONS   Avoid all smoking, herbs, alcohol, and unprescribed drugs. These chemicals affect the formation and growth of the baby.  Follow your caregiver's instructions regarding medicine use. There are medicines that  are either safe or unsafe to take during pregnancy.  Exercise only as directed by your caregiver. Experiencing uterine cramps is a good sign to stop exercising.  Continue to eat regular, healthy meals.  Wear a good support bra for breast tenderness.  Do not use hot tubs, steam rooms, or saunas.  Wear your seat belt at all times when driving.  Avoid raw meat, uncooked cheese, cat litter boxes, and soil used by cats. These carry germs that can cause birth defects in the baby.  Take your prenatal vitamins.  Try taking a stool softener (if your caregiver approves) if you develop constipation. Eat more high-fiber foods, such as fresh vegetables or fruit and whole grains. Drink plenty of fluids to keep your urine clear or pale yellow.  Take warm sitz baths to soothe any pain or discomfort caused by hemorrhoids.  Use hemorrhoid cream if your caregiver approves.  If you develop varicose veins, wear support hose. Elevate your feet for 15 minutes, 3-4 times a day. Limit salt in your diet.  Avoid heavy lifting, wear low heal shoes, and practice good posture.  Rest a lot with your legs elevated if you have leg cramps or low back pain.  Visit your dentist if you have not gone during your pregnancy. Use a soft toothbrush to brush your teeth and be gentle when you floss.  A sexual relationship may be continued unless your caregiver directs you otherwise.  Do not travel far distances unless it is absolutely necessary and only with the approval of your caregiver.  Take prenatal classes to understand, practice, and ask questions about the labor and delivery.  Make a trial run to the hospital.  Pack your hospital bag.  Prepare the baby's nursery.  Continue to go to all your prenatal visits as directed by your caregiver. SEEK MEDICAL CARE IF:  You are unsure if you are in labor or if your water has broken.  You have dizziness.  You have mild pelvic cramps, pelvic pressure, or nagging pain  in your abdominal area.  You have persistent nausea, vomiting, or diarrhea.  You have a bad smelling vaginal discharge.  You have pain with urination. SEEK IMMEDIATE MEDICAL CARE IF:   You have a fever.  You are leaking fluid from your vagina.  You have spotting or bleeding from your vagina.  You have severe abdominal cramping or pain.  You have rapid weight loss or gain.  You have shortness of breath with chest pain.  You notice sudden or extreme swelling of your face, hands, ankles, feet, or legs.  You have not felt your baby move in over an hour.  You have severe headaches that do not go away with medicine.  You have vision changes. Document Released: 10/05/2001 Document Revised: 10/16/2013 Document Reviewed: 12/12/2012 [facility] Patient Information 2015 [place], [place]. This information is not intended to replace advice given to you by your health care provider. Make sure you discuss any questions you have with your health care provider.   Sciatica Rehab Ask your health care provider which exercises are safe for you. Do exercises exactly as told by your health care provider and adjust them as directed. It is normal to feel mild stretching, pulling, tightness, or discomfort as you do these exercises, but you should stop right away if you feel sudden pain or your pain gets worse.Do not begin these exercises until told by your health care provider. Stretching and range of motion exercises These exercises warm up your muscles and joints and improve the movement and flexibility of your hips and your back. These exercises also help to relieve pain, numbness, and tingling. Exercise A: Sciatic nerve glide 1. Sit in a chair with your head facing down toward your chest. Place your hands behind your back. Let your shoulders slump forward. 2. Slowly straighten one of your knees while you tilt your head back as if you are looking toward the ceiling. Only straighten your leg as far as you  can without making your symptoms worse. 3. Hold for __________ seconds. 4. Slowly return to the starting position. 5. Repeat with your other leg. Repeat __________ times. Complete this exercise __________ times a day. Exercise B: Knee to chest with hip adduction and internal rotation  1. Lie on your back on a firm surface with both legs straight. 2. Bend one of your knees  and move it up toward your chest until you feel a gentle stretch in your lower back and buttock. Then, move your knee toward the shoulder that is on the opposite side from your leg. ? Hold your leg in this position by holding onto the front of your knee. 3. Hold for __________ seconds. 4. Slowly return to the starting position. 5. Repeat with your other leg. Repeat __________ times. Complete this exercise __________ times a day. Exercise C: Prone extension on elbows  1. Lie on your abdomen on a firm surface. A bed may be too soft for this exercise. 2. Prop yourself up on your elbows. 3. Use your arms to help lift your chest up until you feel a gentle stretch in your abdomen and your lower back. ? This will place some of your body weight on your elbows. If this is uncomfortable, try stacking pillows under your chest. ? Your hips should stay down, against the surface that you are lying on. Keep your hip and back muscles relaxed. 4. Hold for __________ seconds. 5. Slowly relax your upper body and return to the starting position. Repeat __________ times. Complete this exercise __________ times a day. Strengthening exercises These exercises build strength and endurance in your back. Endurance is the ability to use your muscles for a long time, even after they get tired. Exercise D: Pelvic tilt 1. Lie on your back on a firm surface. Bend your knees and keep your feet flat. 2. Tense your abdominal muscles. Tip your pelvis up toward the ceiling and flatten your lower back into the floor. ? To help with this exercise, you may  place a small towel under your lower back and try to push your back into the towel. 3. Hold for __________ seconds. 4. Let your muscles relax completely before you repeat this exercise. Repeat __________ times. Complete this exercise __________ times a day. Exercise E: Alternating arm and leg raises  1. Get on your hands and knees on a firm surface. If you are on a hard floor, you may want to use padding to cushion your knees, such as an exercise mat. 2. Line up your arms and legs. Your hands should be below your shoulders, and your knees should be below your hips. 3. Lift your left leg behind you. At the same time, raise your right arm and straighten it in front of you. ? Do not lift your leg higher than your hip. ? Do not lift your arm higher than your shoulder. ? Keep your abdominal and back muscles tight. ? Keep your hips facing the ground. ? Do not arch your back. ? Keep your balance carefully, and do not hold your breath. 4. Hold for __________ seconds. 5. Slowly return to the starting position and repeat with your right leg and your left arm. Repeat __________ times. Complete this exercise __________ times a day. Posture and body mechanics  Body mechanics refers to the movements and positions of your body while you do your daily activities. Posture is part of body mechanics. Good posture and healthy body mechanics can help to relieve stress in your body's tissues and joints. Good posture means that your spine is in its natural S-curve position (your spine is neutral), your shoulders are pulled back slightly, and your head is not tipped forward. The following are general guidelines for applying improved posture and body mechanics to your everyday activities. Standing   When standing, keep your spine neutral and your feet about hip-width apart. Keep a  slight bend in your knees. Your ears, shoulders, and hips should line up.  When you do a task in which you stand in one place for a long  time, place one foot up on a stable object that is 2-4 inches (5-10 cm) high, such as a footstool. This helps keep your spine neutral. Sitting   When sitting, keep your spine neutral and keep your feet flat on the floor. Use a footrest, if necessary, and keep your thighs parallel to the floor. Avoid rounding your shoulders, and avoid tilting your head forward.  When working at a desk or a computer, keep your desk at a height where your hands are slightly lower than your elbows. Slide your chair under your desk so you are close enough to maintain good posture.  When working at a computer, place your monitor at a height where you are looking straight ahead and you do not have to tilt your head forward or downward to look at the screen. Resting   When lying down and resting, avoid positions that are most painful for you.  If you have pain with activities such as sitting, bending, stooping, or squatting (flexion-based activities), lie in a position in which your body does not bend very much. For example, avoid curling up on your side with your arms and knees near your chest (fetal position).  If you have pain with activities such as standing for a long time or reaching with your arms (extension-based activities), lie with your spine in a neutral position and bend your knees slightly. Try the following positions: ? Lying on your side with a pillow between your knees. ? Lying on your back with a pillow under your knees. Lifting   When lifting objects, keep your feet at least shoulder-width apart and tighten your abdominal muscles.  Bend your knees and hips and keep your spine neutral. It is important to lift using the strength of your legs, not your back. Do not lock your knees straight out.  Always ask for help to lift heavy or awkward objects. This information is not intended to replace advice given to you by your health care provider. Make sure you discuss any questions you have with your  health care provider. Document Released: 10/11/2005 Document Revised: 06/17/2016 Document Reviewed: 06/27/2015 [employer] Interactive Patient Education  [employer].

## 2017-12-06 LAB — CBC
Hematocrit: 35.7 % (ref 34.0–46.6)
Hemoglobin: 12 g/dL (ref 11.1–15.9)
MCH: 33.5 pg — AB (ref 26.6–33.0)
MCHC: 33.6 g/dL (ref 31.5–35.7)
MCV: 100 fL — ABNORMAL HIGH (ref 79–97)
Platelets: 307 10*3/uL (ref 150–379)
RBC: 3.58 x10E6/uL — AB (ref 3.77–5.28)
RDW: 13.8 % (ref 12.3–15.4)
WBC: 13 10*3/uL — ABNORMAL HIGH (ref 3.4–10.8)

## 2017-12-06 LAB — GLUCOSE TOLERANCE, 2 HOURS W/ 1HR
GLUCOSE, 1 HOUR: 134 mg/dL (ref 65–179)
GLUCOSE, 2 HOUR: 102 mg/dL (ref 65–152)
Glucose, Fasting: 84 mg/dL (ref 65–91)

## 2017-12-06 LAB — HIV ANTIBODY (ROUTINE TESTING W REFLEX): HIV SCREEN 4TH GENERATION: NONREACTIVE

## 2017-12-06 LAB — ANTIBODY SCREEN: Antibody Screen: NEGATIVE

## 2017-12-06 LAB — RPR: RPR: NONREACTIVE

## 2017-12-20 ENCOUNTER — Ambulatory Visit: Payer: Medicaid Other | Admitting: Nurse Practitioner

## 2017-12-22 ENCOUNTER — Encounter: Payer: Self-pay | Admitting: Women's Health

## 2017-12-22 ENCOUNTER — Other Ambulatory Visit: Payer: Self-pay

## 2017-12-22 ENCOUNTER — Ambulatory Visit (INDEPENDENT_AMBULATORY_CARE_PROVIDER_SITE_OTHER): Payer: Medicaid Other | Admitting: Women's Health

## 2017-12-22 VITALS — BP 126/78 | HR 118 | Wt 293.0 lb

## 2017-12-22 DIAGNOSIS — O360130 Maternal care for anti-D [Rh] antibodies, third trimester, not applicable or unspecified: Secondary | ICD-10-CM

## 2017-12-22 DIAGNOSIS — Z6791 Unspecified blood type, Rh negative: Secondary | ICD-10-CM | POA: Diagnosis not present

## 2017-12-22 DIAGNOSIS — O0993 Supervision of high risk pregnancy, unspecified, third trimester: Secondary | ICD-10-CM

## 2017-12-22 DIAGNOSIS — Z3A30 30 weeks gestation of pregnancy: Secondary | ICD-10-CM | POA: Diagnosis not present

## 2017-12-22 DIAGNOSIS — O10919 Unspecified pre-existing hypertension complicating pregnancy, unspecified trimester: Secondary | ICD-10-CM

## 2017-12-22 DIAGNOSIS — Z331 Pregnant state, incidental: Secondary | ICD-10-CM

## 2017-12-22 DIAGNOSIS — Z1389 Encounter for screening for other disorder: Secondary | ICD-10-CM

## 2017-12-22 DIAGNOSIS — O10913 Unspecified pre-existing hypertension complicating pregnancy, third trimester: Secondary | ICD-10-CM | POA: Diagnosis not present

## 2017-12-22 DIAGNOSIS — O26893 Other specified pregnancy related conditions, third trimester: Secondary | ICD-10-CM

## 2017-12-22 LAB — POCT URINALYSIS DIPSTICK
Blood, UA: NEGATIVE
Glucose, UA: NEGATIVE
Ketones, UA: NEGATIVE
LEUKOCYTES UA: NEGATIVE
NITRITE UA: NEGATIVE
PROTEIN UA: NEGATIVE

## 2017-12-22 NOTE — Progress Notes (Signed)
   HIGH-RISK PREGNANCY VISIT Patient name: Christie Morrow MRN [id number]  Date of birth: [birth year] Chief Complaint:   High Risk Gestation  History of Present Illness:   Christie Morrow is a 37 y.o. [id number] female at 4253w2d with an Estimated Date of Delivery: 02/28/18 being seen today for ongoing management of a high-risk pregnancy [facility]  Today she reports no complaints. Contractions: Not present. Vag. Bleeding: None.  Movement: Present. denies leaking of fluid.  Review of Systems:   Pertinent items are noted in HPI Denies abnormal vaginal discharge w/ itching/odor/irritation, headaches, visual changes, shortness of breath, chest pain, abdominal pain, severe nausea/vomiting, or problems with urination or bowel movements unless otherwise stated above. Pertinent History Reviewed:  Reviewed past medical,surgical, social, obstetrical and family history.  Reviewed problem list, medications and allergies. Physical Assessment:   Vitals:   12/22/17 1516  BP: 126/78  Pulse: (!) 118  Weight: 293 lb (132.9 kg)  Body mass index is 45.89 kg/m.           Physical Examination:   General appearance: alert, well appearing, and in no distress  Mental status: alert, oriented to person, place, and time  Skin: warm & dry   Extremities: Edema: None    Cardiovascular: normal heart rate noted  Respiratory: normal respiratory effort, no distress  Abdomen: gravid, soft, non-tender  Pelvic: Cervical exam deferred         Fetal Status: Fetal Heart Rate (bpm): 148 Fundal Height: 32 cm Movement: Present    Fetal Surveillance Testing today: doppler   Results for orders placed or performed in visit on 12/22/17 (from the past 24 hour(s))  POCT urinalysis dipstick   Collection Time: 12/22/17  3:18 PM  Result Value Ref Range   Color, UA     Clarity, UA     Glucose, UA neg    Bilirubin, UA     Ketones, UA neg    Spec Grav, UA  1.010 - 1.025   Blood, UA neg    pH, UA  5.0 - 8.0   Protein, UA neg    Urobilinogen, UA  0.2 or 1.0 E.U./dL   Nitrite, UA neg    Leukocytes, UA Negative Negative   Appearance     Odor      Assessment & Plan:  1) High-risk pregnancy G3P1011 at 6153w2d with an Estimated Date of Delivery: 02/28/18   2) CHTN- no meds, stable, continue baby asa  3) Rh neg, Rhogam today  Meds: No orders of the defined types were placed in this encounter.   Labs/procedures today: rhogam  Treatment Plan:  Growth u/s @ 34, 38wks     2x/wk testing nst/sono @ 32wks or weekly BPP   Deliver @ 40wks (no meds), 39wks (meds)  Reviewed: Preterm labor symptoms and general obstetric precautions including but not limited to vaginal bleeding, contractions, leaking of fluid and fetal movement were reviewed in detail with the patient.  All questions were answered.  Follow-up: Return in about 2 weeks (around 01/05/2018) for HROB, US: BPP/Dopp on Mon then HROB/NST on Thurs .  Orders Placed This Encounter  Procedures  . US FETAL BPP WO NON STRESS  . US UA Cord Doppler  . RHO (D) Immune Globulin  . POCT urinalysis dipstick   Cheral MarkerKimberly R Booker CNM, [facility]NP-BC 12/22/2017 3:34 PM

## 2017-12-22 NOTE — Patient Instructions (Signed)
Christie Morrow, I greatly value your feedback.  If you receive a survey following your visit with us today, we appreciate you taking the time to fill it out.  Thanks, Christie HaffKim Fionna Merriott, CNM, WHNP-BC   Call the office [phone]) or go to [facility] if:  You begin to have strong, frequent contractions  Your water breaks.  Sometimes it is a big gush of fluid, sometimes it is just a trickle that keeps getting your panties wet or running down your legs  You have vaginal bleeding.  It is normal to have a small amount of spotting if your cervix was checked.   You don't feel your baby moving like normal.  If you don't, get you something to eat and drink and lay down and focus on feeling your baby move.  You should feel at least 10 movements in 2 hours.  If you don't, you should call the office or go to [facility].    Tdap Vaccine  It is recommended that you get the Tdap vaccine during the third trimester of EACH pregnancy to help protect your baby from getting pertussis (whooping cough)  27-36 weeks is the BEST time to do this so that you can pass the protection on to your baby. During pregnancy is better than after pregnancy, but if you are unable to get it during pregnancy it will be offered at the hospital.   You can get this vaccine at the health department or your family doctor  Everyone who will be around your baby should also be up-to-date on their vaccines. Adults (who are not pregnant) only need 1 dose of Tdap during adulthood.   Third Trimester of Pregnancy The third trimester is from week 29 through week 42, months 7 through 9. The third trimester is a time when the fetus is growing rapidly. At the end of the ninth month, the fetus is about 20 inches in length and weighs 6-10 pounds.  BODY CHANGES Your body goes through many changes during pregnancy. The changes vary from woman to woman.   Your weight will continue to increase. You can expect to gain 25-35 pounds (11-16 kg) by the  end of the pregnancy.  You may begin to get stretch marks on your hips, abdomen, and breasts.  You may urinate more often because the fetus is moving lower into your pelvis and pressing on your bladder.  You may develop or continue to have heartburn as a result of your pregnancy.  You may develop constipation because certain hormones are causing the muscles that push waste through your intestines to slow down.  You may develop hemorrhoids or swollen, bulging veins (varicose veins).  You may have pelvic pain because of the weight gain and pregnancy hormones relaxing your joints between the bones in your pelvis. Backaches may result from overexertion of the muscles supporting your posture.  You may have changes in your hair. These can include thickening of your hair, rapid growth, and changes in texture. Some women also have hair loss during or after pregnancy, or hair that feels dry or thin. Your hair will most likely return to normal after your baby is born.  Your breasts will continue to grow and be tender. A yellow discharge may leak from your breasts called colostrum.  Your belly button may stick out.  You may feel short of breath because of your expanding uterus.  You may notice the fetus "dropping," or moving lower in your abdomen.  You may have a bloody  mucus discharge. This usually occurs a few days to a week before labor begins.  Your cervix becomes thin and soft (effaced) near your due date. WHAT TO EXPECT AT YOUR PRENATAL EXAMS  You will have prenatal exams every 2 weeks until week 36. Then, you will have weekly prenatal exams. During a routine prenatal visit:  You will be weighed to make sure you and the fetus are growing normally.  Your blood pressure is taken.  Your abdomen will be measured to track your baby's growth.  The fetal heartbeat will be listened to.  Any test results from the previous visit will be discussed.  You may have a cervical check near your due  date to see if you have effaced. At around 36 weeks, your caregiver will check your cervix. At the same time, your caregiver will also perform a test on the secretions of the vaginal tissue. This test is to determine if a type of bacteria, Group B streptococcus, is present. Your caregiver will explain this further. Your caregiver may ask you:  What your birth plan is.  How you are feeling.  If you are feeling the baby move.  If you have had any abnormal symptoms, such as leaking fluid, bleeding, severe headaches, or abdominal cramping.  If you have any questions. Other tests or screenings that may be performed during your third trimester include:  Blood tests that check for low iron levels (anemia).  Fetal testing to check the health, activity level, and growth of the fetus. Testing is done if you have certain medical conditions or if there are problems during the pregnancy. FALSE LABOR You may feel small, irregular contractions that eventually go away. These are called Braxton Hicks contractions, or false labor. Contractions may last for hours, days, or even weeks before true labor sets in. If contractions come at regular intervals, intensify, or become painful, it is best to be seen by your caregiver.  SIGNS OF LABOR   Menstrual-like cramps.  Contractions that are 5 minutes apart or less.  Contractions that start on the top of the uterus and spread down to the lower abdomen and back.  A sense of increased pelvic pressure or back pain.  A watery or bloody mucus discharge that comes from the vagina. If you have any of these signs before the 37th week of pregnancy, call your caregiver right away. You need to go to the hospital to get checked immediately. HOME CARE INSTRUCTIONS   Avoid all smoking, herbs, alcohol, and unprescribed drugs. These chemicals affect the formation and growth of the baby.  Follow your caregiver's instructions regarding medicine use. There are medicines that  are either safe or unsafe to take during pregnancy.  Exercise only as directed by your caregiver. Experiencing uterine cramps is a good sign to stop exercising.  Continue to eat regular, healthy meals.  Wear a good support bra for breast tenderness.  Do not use hot tubs, steam rooms, or saunas.  Wear your seat belt at all times when driving.  Avoid raw meat, uncooked cheese, cat litter boxes, and soil used by cats. These carry germs that can cause birth defects in the baby.  Take your prenatal vitamins.  Try taking a stool softener (if your caregiver approves) if you develop constipation. Eat more high-fiber foods, such as fresh vegetables or fruit and whole grains. Drink plenty of fluids to keep your urine clear or pale yellow.  Take warm sitz baths to soothe any pain or discomfort caused by hemorrhoids.  Use hemorrhoid cream if your caregiver approves.  If you develop varicose veins, wear support hose. Elevate your feet for 15 minutes, 3-4 times a day. Limit salt in your diet.  Avoid heavy lifting, wear low heal shoes, and practice good posture.  Rest a lot with your legs elevated if you have leg cramps or low back pain.  Visit your dentist if you have not gone during your pregnancy. Use a soft toothbrush to brush your teeth and be gentle when you floss.  A sexual relationship may be continued unless your caregiver directs you otherwise.  Do not travel far distances unless it is absolutely necessary and only with the approval of your caregiver.  Take prenatal classes to understand, practice, and ask questions about the labor and delivery.  Make a trial run to the hospital.  Pack your hospital bag.  Prepare the baby's nursery.  Continue to go to all your prenatal visits as directed by your caregiver. SEEK MEDICAL CARE IF:  You are unsure if you are in labor or if your water has broken.  You have dizziness.  You have mild pelvic cramps, pelvic pressure, or nagging pain  in your abdominal area.  You have persistent nausea, vomiting, or diarrhea.  You have a bad smelling vaginal discharge.  You have pain with urination. SEEK IMMEDIATE MEDICAL CARE IF:   You have a fever.  You are leaking fluid from your vagina.  You have spotting or bleeding from your vagina.  You have severe abdominal cramping or pain.  You have rapid weight loss or gain.  You have shortness of breath with chest pain.  You notice sudden or extreme swelling of your face, hands, ankles, feet, or legs.  You have not felt your baby move in over an hour.  You have severe headaches that do not go away with medicine.  You have vision changes. Document Released: 10/05/2001 Document Revised: 10/16/2013 Document Reviewed: 12/12/2012 [facility] Patient Information 2015 [place], [place]. This information is not intended to replace advice given to you by your health care provider. Make sure you discuss any questions you have with your health care provider.

## 2017-12-22 NOTE — Progress Notes (Signed)
Pt given Rhogam today without complications 

## 2018-01-02 ENCOUNTER — Ambulatory Visit (INDEPENDENT_AMBULATORY_CARE_PROVIDER_SITE_OTHER): Payer: Medicaid Other | Admitting: Obstetrics & Gynecology

## 2018-01-02 ENCOUNTER — Ambulatory Visit (INDEPENDENT_AMBULATORY_CARE_PROVIDER_SITE_OTHER): Payer: Medicaid Other

## 2018-01-02 ENCOUNTER — Other Ambulatory Visit: Payer: Self-pay

## 2018-01-02 ENCOUNTER — Encounter: Payer: Self-pay | Admitting: Obstetrics & Gynecology

## 2018-01-02 VITALS — BP 122/70 | HR 122 | Wt 292.0 lb

## 2018-01-02 DIAGNOSIS — Z1389 Encounter for screening for other disorder: Secondary | ICD-10-CM | POA: Diagnosis not present

## 2018-01-02 DIAGNOSIS — O10919 Unspecified pre-existing hypertension complicating pregnancy, unspecified trimester: Secondary | ICD-10-CM

## 2018-01-02 DIAGNOSIS — O10913 Unspecified pre-existing hypertension complicating pregnancy, third trimester: Secondary | ICD-10-CM | POA: Diagnosis not present

## 2018-01-02 DIAGNOSIS — Z3A31 31 weeks gestation of pregnancy: Secondary | ICD-10-CM | POA: Diagnosis not present

## 2018-01-02 DIAGNOSIS — O0993 Supervision of high risk pregnancy, unspecified, third trimester: Secondary | ICD-10-CM

## 2018-01-02 DIAGNOSIS — O0990 Supervision of high risk pregnancy, unspecified, unspecified trimester: Secondary | ICD-10-CM

## 2018-01-02 DIAGNOSIS — Z331 Pregnant state, incidental: Secondary | ICD-10-CM

## 2018-01-02 LAB — POCT URINALYSIS DIPSTICK
Blood, UA: NEGATIVE
Glucose, UA: NEGATIVE
KETONES UA: NEGATIVE
Leukocytes, UA: NEGATIVE
NITRITE UA: NEGATIVE

## 2018-01-02 NOTE — Progress Notes (Signed)
US 31+6 wks,breech,anterior pl gr 0,normal ov's bilat,BPP 8/8,FHR 17 cm,RI .63,.65=63%,CX 5.2 cm

## 2018-01-02 NOTE — Progress Notes (Signed)
   HIGH-RISK PREGNANCY VISIT Patient name: Christie Morrow MRN [id number]  Date of birth: December 29, 1980 Chief Complaint:   High Risk Gestation (u/s)  History of Present Illness:   Christie Morrow is a 37 y.o. 393P1011 female at 5610w6d with an Estimated Date of Delivery: 02/28/18 being seen today for ongoing management of a high-risk pregnancy complicated by chronic HTN.  Today she reports no complaints. Contractions: Not present. Vag. Bleeding: None.  Movement: Present. denies leaking of fluid.  Review of Systems:   Pertinent items are noted in HPI Denies abnormal vaginal discharge w/ itching/odor/irritation, headaches, visual changes, shortness of breath, chest pain, abdominal pain, severe nausea/vomiting, or problems with urination or bowel movements unless otherwise stated above. Pertinent History Reviewed:  Reviewed past medical,surgical, social, obstetrical and family history.  Reviewed problem list, medications and allergies. Physical Assessment:   Vitals:   01/02/18 1354  BP: 122/70  Pulse: (!) 122  Weight: 292 lb (132.5 kg)  Body mass index is 45.73 kg/m.           Physical Examination:   General appearance: alert, well appearing, and in no distress  Mental status: alert, oriented to person, place, and time  Skin: warm & dry   Extremities: Edema: None    Cardiovascular: normal heart rate noted  Respiratory: normal respiratory effort, no distress  Abdomen: gravid, soft, non-tender  Pelvic: Cervical exam deferred         Fetal Status:     Movement: Present    Fetal Surveillance Testing today: BPP 8/8 with excellent Dopplers   Results for orders placed or performed in visit on 01/02/18 (from the past 24 hour(s))  POCT urinalysis dipstick   Collection Time: 01/02/18  1:55 PM  Result Value Ref Range   Color, UA     Clarity, UA     Glucose, UA neg    Bilirubin, UA     Ketones, UA neg    Spec Grav, UA  1.010 - 1.025   Blood, UA neg    pH, UA  5.0 - 8.0   Protein, UA trace    Urobilinogen, UA  0.2 or 1.0 E.U./dL   Nitrite, UA neg    Leukocytes, UA Negative Negative   Appearance     Odor      Assessment & Plan:  1) High-risk pregnancy G3P1011 at 7910w6d with an Estimated Date of Delivery: 02/28/18   2) CHTN, stable, no meds    Meds: No orders of the defined types were placed in this encounter.   Labs/procedures today: sonogram is normal  Treatment Plan:  For now off meds, twice weekly NST with sonogram 36 weeks for growth is appropriate for surveillance.  However if meds need switch to our alternating method  Reviewed: Preterm labor symptoms and general obstetric precautions including but not limited to vaginal bleeding, contractions, leaking of fluid and fetal movement were reviewed in detail with the patient.  All questions were answered.  Follow-up: Return in about 3 days (around 01/05/2018) for NST, HROB.  Orders Placed This Encounter  Procedures  . POCT urinalysis dipstick   Lazaro ArmsLuther H Sidney Kann  01/02/2018 2:12 PM

## 2018-01-05 ENCOUNTER — Ambulatory Visit (INDEPENDENT_AMBULATORY_CARE_PROVIDER_SITE_OTHER): Payer: Medicaid Other | Admitting: Women's Health

## 2018-01-05 ENCOUNTER — Encounter: Payer: Self-pay | Admitting: Women's Health

## 2018-01-05 VITALS — BP 142/76 | HR 120 | Wt 292.5 lb

## 2018-01-05 DIAGNOSIS — Z1389 Encounter for screening for other disorder: Secondary | ICD-10-CM

## 2018-01-05 DIAGNOSIS — Z331 Pregnant state, incidental: Secondary | ICD-10-CM

## 2018-01-05 DIAGNOSIS — O1213 Gestational proteinuria, third trimester: Secondary | ICD-10-CM | POA: Diagnosis not present

## 2018-01-05 DIAGNOSIS — O26899 Other specified pregnancy related conditions, unspecified trimester: Secondary | ICD-10-CM

## 2018-01-05 DIAGNOSIS — O0990 Supervision of high risk pregnancy, unspecified, unspecified trimester: Secondary | ICD-10-CM

## 2018-01-05 DIAGNOSIS — O10919 Unspecified pre-existing hypertension complicating pregnancy, unspecified trimester: Secondary | ICD-10-CM

## 2018-01-05 DIAGNOSIS — O10913 Unspecified pre-existing hypertension complicating pregnancy, third trimester: Secondary | ICD-10-CM

## 2018-01-05 DIAGNOSIS — O0993 Supervision of high risk pregnancy, unspecified, third trimester: Secondary | ICD-10-CM

## 2018-01-05 DIAGNOSIS — Z6791 Unspecified blood type, Rh negative: Secondary | ICD-10-CM

## 2018-01-05 DIAGNOSIS — Z3A32 32 weeks gestation of pregnancy: Secondary | ICD-10-CM

## 2018-01-05 LAB — POCT URINALYSIS DIPSTICK
Blood, UA: NEGATIVE
GLUCOSE UA: NEGATIVE
KETONES UA: NEGATIVE
Leukocytes, UA: NEGATIVE
Nitrite, UA: NEGATIVE

## 2018-01-05 NOTE — Patient Instructions (Signed)
Christie Morrow, I greatly value your feedback.  If you receive a survey following your visit with [place] today, we appreciate you taking the time to fill it out.  Thanks, Joellyn Haff, CNM, WHNP-BC   Call the office [phone]) or go to [facility] if:  You begin to have strong, frequent contractions  Your water breaks.  Sometimes it is a big gush of fluid, sometimes it is just a trickle that keeps getting your panties wet or running down your legs  You have vaginal bleeding.  It is normal to have a small amount of spotting if your cervix was checked.   You don't feel your baby moving like normal.  If you don't, get you something to eat and drink and lay down and focus on feeling your baby move.  You should feel at least 10 movements in 2 hours.  If you don't, you should call the office or go to [facility].     Preterm Labor and Birth Information The normal length of a pregnancy is 39-41 weeks. Preterm labor is when labor starts before 37 completed weeks of pregnancy. What are the risk factors for preterm labor? Preterm labor is more likely to occur in women who:  Have certain infections during pregnancy such as a bladder infection, sexually transmitted infection, or infection inside the uterus (chorioamnionitis).  Have a shorter-than-normal cervix.  Have gone into preterm labor before.  Have had surgery on their cervix.  Are younger than age 75 or older than age 13.  Are African American.  Are pregnant with twins or multiple babies (multiple gestation).  Take street drugs or smoke while pregnant.  Do not gain enough weight while pregnant.  Became pregnant shortly after having been pregnant.  What are the symptoms of preterm labor? Symptoms of preterm labor include:  Cramps similar to those that can happen during a menstrual period. The cramps may happen with diarrhea.  Pain in the abdomen or lower back.  Regular uterine contractions that may feel like tightening of  the abdomen.  A feeling of increased pressure in the pelvis.  Increased watery or bloody mucus discharge from the vagina.  Water breaking (ruptured amniotic sac).  Why is it important to recognize signs of preterm labor? It is important to recognize signs of preterm labor because babies who are born prematurely may not be fully developed. This can put them at an increased risk for:  Long-term (chronic) heart and lung problems.  Difficulty immediately after birth with regulating body systems, including blood sugar, body temperature, heart rate, and breathing rate.  Bleeding in the brain.  Cerebral palsy.  Learning difficulties.  Death.  These risks are highest for babies who are born before 34 weeks of pregnancy. How is preterm labor treated? Treatment depends on the length of your pregnancy, your condition, and the health of your baby. It may involve:  Having a stitch (suture) placed in your cervix to prevent your cervix from opening too early (cerclage).  Taking or being given medicines, such as: ? Hormone medicines. These may be given early in pregnancy to help support the pregnancy. ? Medicine to stop contractions. ? Medicines to help mature the baby's lungs. These may be prescribed if the risk of delivery is high. ? Medicines to prevent your baby from developing cerebral palsy.  If the labor happens before 34 weeks of pregnancy, you may need to stay in the hospital. What should I do if I think I am in preterm labor? If  you think that you are going into preterm labor, call your health care provider right away. How can I prevent preterm labor in future pregnancies? To increase your chance of having a full-term pregnancy:  Do not use any tobacco products, such as cigarettes, chewing tobacco, and e-cigarettes. If you need help quitting, ask your health care provider.  Do not use street drugs or medicines that have not been prescribed to you during your pregnancy.  Talk  with your health care provider before taking any herbal supplements, even if you have been taking them regularly.  Make sure you gain a healthy amount of weight during your pregnancy.  Watch for infection. If you think that you might have an infection, get it checked right away.  Make sure to tell your health care provider if you have gone into preterm labor before.  This information is not intended to replace advice given to you by your health care provider. Make sure you discuss any questions you have with your health care provider. Document Released: 01/01/2004 Document Revised: 03/23/2016 Document Reviewed: 03/03/2016 Elsevier Interactive Patient Education  2018 [employer].

## 2018-01-05 NOTE — Progress Notes (Addendum)
   HIGH-RISK PREGNANCY VISIT Patient name: Christie Morrow MRN [id number]  Date of birth: Jun 24, 1981 Chief Complaint:   High Risk Gestation (NST)  History of Present Illness:   Christie Morrow is a 37 y.o. 433P1011 female at [redacted]w[redacted]d with an Estimated Date of Delivery: 02/28/18 being seen today for ongoing management of a high-risk pregnancy complicated by chronic HTN-no meds currently.   Today she reports no complaints. Denies ha, visual changes, ruq/epigastric pain, n/v.   Contractions: Not present. Vag. Bleeding: None.  Movement: Present. denies leaking of fluid.  Review of Systems:   Pertinent items are noted in HPI Denies abnormal vaginal discharge w/ itching/odor/irritation, headaches, visual changes, shortness of breath, chest pain, abdominal pain, severe nausea/vomiting, or problems with urination or bowel movements unless otherwise stated above. Pertinent History Reviewed:  Reviewed past medical,surgical, social, obstetrical and family history.  Reviewed problem list, medications and allergies. Physical Assessment:   Vitals:   01/05/18 1513  BP: (!) 142/76  Pulse: (!) 120  Weight: 292 lb 8 oz (132.7 kg)  Body mass index is 45.81 kg/m.           Physical Examination:   General appearance: alert, well appearing, and in no distress  Mental status: alert, oriented to person, place, and time  Skin: warm & dry   Extremities: Edema: None, DTRs 2+, no clonus   Cardiovascular: normal heart rate noted  Respiratory: normal respiratory effort, no distress  Abdomen: gravid, soft, non-tender  Pelvic: Cervical exam deferred         Fetal Status: Fetal Heart Rate (bpm): 155 Fundal Height: 33 cm Movement: Present Presentation: Complete Breech  Fetal Surveillance Testing today: NST: FHR baseline 155 bpm, Variability: moderate, Accelerations:present, Decelerations:  Absent= Cat 1/Reactive Toco: none     Results for orders placed or performed in visit on 01/05/18 (from the past 24 hour(s))  POCT  urinalysis dipstick   Collection Time: 01/05/18  3:14 PM  Result Value Ref Range   Color, UA     Clarity, UA     Glucose, UA neg    Bilirubin, UA     Ketones, UA neg    Spec Grav, UA  1.010 - 1.025   Blood, UA neg    pH, UA  5.0 - 8.0   Protein, UA 1+    Urobilinogen, UA  0.2 or 1.0 E.U./dL   Nitrite, UA neg    Leukocytes, UA Negative Negative   Appearance     Odor      Assessment & Plan:  1) High-risk pregnancy G3P1011 at 3122w2d with an Estimated Date of Delivery: 02/28/18   2) CHTN, no meds currently, bp slightly elevated today w/ 1+ proteinuria, asymptomatic, will get pre-e labs. Reviewed pre-e s/s, reasons to seek care   Meds: No orders of the defined types were placed in this encounter.   Labs/procedures today: cbc, cmp, P:C ratio, NST  Treatment Plan:  2x/wk nst, if goes on meds or becomes pre-eclamptic- will do nst alt w/ bpp/dopp  Reviewed: Preterm labor symptoms and general obstetric precautions including but not limited to vaginal bleeding, contractions, leaking of fluid and fetal movement were reviewed in detail with the patient.  All questions were answered.  Follow-up: Return for Mon/Thu hrob/nst x 3wks.  Orders Placed This Encounter  Procedures  . CBC  . Comprehensive metabolic panel  . Protein / creatinine ratio, urine  . POCT urinalysis dipstick   Cheral MarkerKimberly R Kayton Dunaj CNM, [facility] 01/05/2018 4:57 PM

## 2018-01-06 ENCOUNTER — Encounter: Payer: Self-pay | Admitting: Women's Health

## 2018-01-06 DIAGNOSIS — O1490 Unspecified pre-eclampsia, unspecified trimester: Secondary | ICD-10-CM | POA: Insufficient documentation

## 2018-01-06 LAB — COMPREHENSIVE METABOLIC PANEL
A/G RATIO: 1.3 (ref 1.2–2.2)
ALBUMIN: 4 g/dL (ref 3.5–5.5)
ALK PHOS: 194 IU/L — AB (ref 39–117)
ALT: 30 IU/L (ref 0–32)
AST: 22 IU/L (ref 0–40)
BUN / CREAT RATIO: 10 (ref 9–23)
BUN: 6 mg/dL (ref 6–20)
CHLORIDE: 100 mmol/L (ref 96–106)
CO2: 21 mmol/L (ref 20–29)
Calcium: 9.4 mg/dL (ref 8.7–10.2)
Creatinine, Ser: 0.58 mg/dL (ref 0.57–1.00)
GFR calc Af Amer: 139 mL/min/{1.73_m2} (ref >59)
GFR calc non Af Amer: 121 mL/min/{1.73_m2} (ref >59)
GLOBULIN, TOTAL: 3 g/dL (ref 1.5–4.5)
GLUCOSE: 75 mg/dL (ref 65–99)
POTASSIUM: 4.6 mmol/L (ref 3.5–5.2)
SODIUM: 137 mmol/L (ref 134–144)
Total Protein: 7 g/dL (ref 6.0–8.5)

## 2018-01-06 LAB — CBC
HEMOGLOBIN: 12.3 g/dL (ref 11.1–15.9)
Hematocrit: 36.4 % (ref 34.0–46.6)
MCH: 33.1 pg — AB (ref 26.6–33.0)
MCHC: 33.8 g/dL (ref 31.5–35.7)
MCV: 98 fL — AB (ref 79–97)
Platelets: 368 10*3/uL (ref 150–379)
RBC: 3.72 x10E6/uL — AB (ref 3.77–5.28)
RDW: 13.9 % (ref 12.3–15.4)
WBC: 14 10*3/uL — ABNORMAL HIGH (ref 3.4–10.8)

## 2018-01-06 LAB — PROTEIN / CREATININE RATIO, URINE
CREATININE, UR: 234.9 mg/dL
Protein, Ur: 83.7 mg/dL
Protein/Creat Ratio: 356 mg/g creat — ABNORMAL HIGH (ref 0–200)

## 2018-01-09 ENCOUNTER — Encounter: Payer: Self-pay | Admitting: Obstetrics and Gynecology

## 2018-01-09 ENCOUNTER — Ambulatory Visit (INDEPENDENT_AMBULATORY_CARE_PROVIDER_SITE_OTHER): Payer: Medicaid Other | Admitting: Obstetrics and Gynecology

## 2018-01-09 ENCOUNTER — Other Ambulatory Visit: Payer: Self-pay

## 2018-01-09 VITALS — BP 118/70 | HR 120 | Wt 292.0 lb

## 2018-01-09 DIAGNOSIS — O10913 Unspecified pre-existing hypertension complicating pregnancy, third trimester: Secondary | ICD-10-CM | POA: Diagnosis not present

## 2018-01-09 DIAGNOSIS — Z331 Pregnant state, incidental: Secondary | ICD-10-CM | POA: Diagnosis not present

## 2018-01-09 DIAGNOSIS — Z3A32 32 weeks gestation of pregnancy: Secondary | ICD-10-CM | POA: Diagnosis not present

## 2018-01-09 DIAGNOSIS — Z1389 Encounter for screening for other disorder: Secondary | ICD-10-CM | POA: Diagnosis not present

## 2018-01-09 DIAGNOSIS — O9989 Other specified diseases and conditions complicating pregnancy, childbirth and the puerperium: Secondary | ICD-10-CM | POA: Diagnosis not present

## 2018-01-09 DIAGNOSIS — R809 Proteinuria, unspecified: Secondary | ICD-10-CM

## 2018-01-09 DIAGNOSIS — O0993 Supervision of high risk pregnancy, unspecified, third trimester: Secondary | ICD-10-CM

## 2018-01-09 DIAGNOSIS — O1213 Gestational proteinuria, third trimester: Secondary | ICD-10-CM

## 2018-01-09 DIAGNOSIS — M5430 Sciatica, unspecified side: Secondary | ICD-10-CM

## 2018-01-09 DIAGNOSIS — O10919 Unspecified pre-existing hypertension complicating pregnancy, unspecified trimester: Secondary | ICD-10-CM

## 2018-01-09 DIAGNOSIS — G894 Chronic pain syndrome: Secondary | ICD-10-CM

## 2018-01-09 LAB — POCT URINALYSIS DIPSTICK
GLUCOSE UA: NEGATIVE
KETONES UA: NEGATIVE
Leukocytes, UA: NEGATIVE
Nitrite, UA: NEGATIVE
RBC UA: NEGATIVE

## 2018-01-09 MED ORDER — CYCLOBENZAPRINE HCL 10 MG PO TABS: 10.0000 mg | ORAL_TABLET | Freq: Three times a day (TID) | ORAL | 1 refills | Status: DC | PRN

## 2018-01-09 NOTE — Progress Notes (Signed)
   LOW-RISK PREGNANCY VISIT Patient name: Christie Morrow MRN [id number]  Date of birth: February 19, 1981 Chief Complaint:   High Risk Gestation (NST)  History of Present Illness:   Christie Morrow is a 37 y.o. 303P1011 female at 4862w6d with an Estimated Date of Delivery: 02/28/18 being seen today for ongoing management of a low-risk pregnancy.  Today she reports no complaints. Contractions: Not present. Vag. Bleeding: None.  Movement: Present. denies leaking of fluid. Review of Systems:   Pertinent items are noted in HPI Denies abnormal vaginal discharge w/ itching/odor/irritation, headaches, visual changes, shortness of breath, chest pain, abdominal pain, severe nausea/vomiting, or problems with urination or bowel movements unless otherwise stated above. Pertinent History Reviewed:  Reviewed past medical,surgical, social, obstetrical and family history.  Reviewed problem list, medications and allergies. Physical Assessment:   Vitals:   01/09/18 1401  BP: 118/70  Pulse: (!) 120  Weight: 292 lb (132.5 kg)  Body mass index is 45.73 kg/m.        Physical Examination:   General appearance: Well appearing, and in no distress  Mental status: Alert, oriented to person, place, and time  Skin: Warm & dry  Cardiovascular: Normal heart rate noted  Respiratory: Normal respiratory effort, no distress  Abdomen: Soft, gravid, nontender  Pelvic:          Extremities: Edema: None reflexes 1+ without clonus upper extremities 1+ reflexes  Fetal Status: Fetal Heart Rate (bpm): 148 Fundal Height: 36 cm Movement: Present    Results for orders placed or performed in visit on 01/09/18 (from the past 24 hour(s))  POCT urinalysis dipstick   Collection Time: 01/09/18  2:03 PM  Result Value Ref Range   Color, UA     Clarity, UA     Glucose, UA neg    Bilirubin, UA     Ketones, UA neg    Spec Grav, UA  1.010 - 1.025   Blood, UA neg    pH, UA  5.0 - 8.0   Protein, UA trace    Urobilinogen, UA  0.2 or 1.0  E.U./dL   Nitrite, UA neg    Leukocytes, UA Negative Negative   Appearance     Odor      Assessment & Plan:  1) Hi-risk pregnancy G3P1011 at 4362w6d chronic hypertension on no meds with an Estimated Date of Delivery: 02/28/18 last week she had a protein creatinine ratio which is returned elevated.  Today she has no elevations of her pressures and no proteinuria  2) sciatica chronic back pain,    Meds: No orders of the defined types were placed in this encounter.  Labs/procedures today: Nonstress test, reactive  Plan:  Continue routine obstetrical care   Reviewed: Term labor symptoms and general obstetric precautions including but not limited to vaginal bleeding, contractions, leaking of fluid and fetal movement were reviewed in detail with the patient.  All questions were answered  Follow-up: We will order 24-hour urine total protein and urine culture Return for NST, HROB.   Orders Placed This Encounter  Procedures  . Urine Culture  . POCT urinalysis dipstick   Tilda BurrowJohn V Tareek Sabo CNM, [facility]BC 01/09/2018 2:23 PM

## 2018-01-11 LAB — URINE CULTURE

## 2018-01-12 ENCOUNTER — Ambulatory Visit (INDEPENDENT_AMBULATORY_CARE_PROVIDER_SITE_OTHER): Payer: Medicaid Other | Admitting: Women's Health

## 2018-01-12 ENCOUNTER — Encounter: Payer: Self-pay | Admitting: Women's Health

## 2018-01-12 VITALS — BP 122/78 | HR 117 | Wt 295.0 lb

## 2018-01-12 DIAGNOSIS — Z331 Pregnant state, incidental: Secondary | ICD-10-CM | POA: Diagnosis not present

## 2018-01-12 DIAGNOSIS — O119 Pre-existing hypertension with pre-eclampsia, unspecified trimester: Secondary | ICD-10-CM

## 2018-01-12 DIAGNOSIS — Z3A33 33 weeks gestation of pregnancy: Secondary | ICD-10-CM | POA: Diagnosis not present

## 2018-01-12 DIAGNOSIS — Z1389 Encounter for screening for other disorder: Secondary | ICD-10-CM

## 2018-01-12 DIAGNOSIS — O10913 Unspecified pre-existing hypertension complicating pregnancy, third trimester: Secondary | ICD-10-CM

## 2018-01-12 DIAGNOSIS — O0990 Supervision of high risk pregnancy, unspecified, unspecified trimester: Secondary | ICD-10-CM

## 2018-01-12 DIAGNOSIS — O0993 Supervision of high risk pregnancy, unspecified, third trimester: Secondary | ICD-10-CM

## 2018-01-12 DIAGNOSIS — O10919 Unspecified pre-existing hypertension complicating pregnancy, unspecified trimester: Secondary | ICD-10-CM

## 2018-01-12 LAB — POCT URINALYSIS DIPSTICK
GLUCOSE UA: NEGATIVE
Ketones, UA: NEGATIVE
Nitrite, UA: NEGATIVE
Protein, UA: NEGATIVE
RBC UA: NEGATIVE

## 2018-01-12 LAB — PROTEIN, URINE, 24 HOUR
PROTEIN 24H UR: 322 mg/(24.h) — AB (ref 30–150)
Protein, Ur: 14.3 mg/dL

## 2018-01-12 NOTE — Patient Instructions (Signed)
Christie Morrow, I greatly value your feedback.  If you receive a survey following your visit with us today, we appreciate you taking the time to fill it out.  Thanks, Kim Booker, CNM, WHNP-BC   Call the office ([phone]) or go to [facility] if:  You begin to have strong, frequent contractions  Your water breaks.  Sometimes it is a big gush of fluid, sometimes it is just a trickle that keeps getting your panties wet or running down your legs  You have vaginal bleeding.  It is normal to have a small amount of spotting if your cervix was checked.   You don't feel your baby moving like normal.  If you don't, get you something to eat and drink and lay down and focus on feeling your baby move.  You should feel at least 10 movements in 2 hours.  If you don't, you should call the office or go to [facility].   Call the office ([phone]) or go to [facility] for these signs of pre-eclampsia:  Severe headache that does not go away with Tylenol  Visual changes- seeing spots, double, blurred vision  Pain under your right breast or upper abdomen that does not go away with Tums or heartburn medicine  Nausea and/or vomiting  Severe swelling in your hands, feet, and face        Preterm Labor and Birth Information The normal length of a pregnancy is 39-41 weeks. Preterm labor is when labor starts before 37 completed weeks of pregnancy. What are the risk factors for preterm labor? Preterm labor is more likely to occur in women who:  Have certain infections during pregnancy such as a bladder infection, sexually transmitted infection, or infection inside the uterus (chorioamnionitis).  Have a shorter-than-normal cervix.  Have gone into preterm labor before.  Have had surgery on their cervix.  Are younger than age 17 or older than age 35.  Are African American.  Are pregnant with twins or multiple babies (multiple gestation).  Take street drugs or smoke while pregnant.  Do  not gain enough weight while pregnant.  Became pregnant shortly after having been pregnant.  What are the symptoms of preterm labor? Symptoms of preterm labor include:  Cramps similar to those that can happen during a menstrual period. The cramps may happen with diarrhea.  Pain in the abdomen or lower back.  Regular uterine contractions that may feel like tightening of the abdomen.  A feeling of increased pressure in the pelvis.  Increased watery or bloody mucus discharge from the vagina.  Water breaking (ruptured amniotic sac).  Why is it important to recognize signs of preterm labor? It is important to recognize signs of preterm labor because babies who are born prematurely may not be fully developed. This can put them at an increased risk for:  Long-term (chronic) heart and lung problems.  Difficulty immediately after birth with regulating body systems, including blood sugar, body temperature, heart rate, and breathing rate.  Bleeding in the brain.  Cerebral palsy.  Learning difficulties.  Death.  These risks are highest for babies who are born before 34 weeks of pregnancy. How is preterm labor treated? Treatment depends on the length of your pregnancy, your condition, and the health of your baby. It may involve:  Having a stitch (suture) placed in your cervix to prevent your cervix from opening too early (cerclage).  Taking or being given medicines, such as: ? Hormone medicines. These may be given early in pregnancy to   help support the pregnancy. ? Medicine to stop contractions. ? Medicines to help mature the baby's lungs. These may be prescribed if the risk of delivery is high. ? Medicines to prevent your baby from developing cerebral palsy.  If the labor happens before 34 weeks of pregnancy, you may need to stay in the hospital. What should I do if I think I am in preterm labor? If you think that you are going into preterm labor, call your health care provider  right away. How can I prevent preterm labor in future pregnancies? To increase your chance of having a full-term pregnancy:  Do not use any tobacco products, such as cigarettes, chewing tobacco, and e-cigarettes. If you need help quitting, ask your health care provider.  Do not use street drugs or medicines that have not been prescribed to you during your pregnancy.  Talk with your health care provider before taking any herbal supplements, even if you have been taking them regularly.  Make sure you gain a healthy amount of weight during your pregnancy.  Watch for infection. If you think that you might have an infection, get it checked right away.  Make sure to tell your health care provider if you have gone into preterm labor before.  This information is not intended to replace advice given to you by your health care provider. Make sure you discuss any questions you have with your health care provider. Document Released: 01/01/2004 Document Revised: 03/23/2016 Document Reviewed: 03/03/2016 Elsevier Interactive Patient Education  2018 [employer]vier Inc[employer]  

## 2018-01-12 NOTE — Progress Notes (Signed)
HIGH-RISK PREGNANCY VISIT Patient name: Christie Morrow Bistline MRN [id number]  Date of birth: [birth year] Chief Complaint:   Routine Prenatal Visit (NST)  History of Present Illness:   Christie Morrow Pore is a 37 y.o. 213P1011 female at 4435w2d with an Estimated Date of Delivery: 02/28/18 being seen today for ongoing management of a high-risk pregnancy [facility]/ superimposed pre-e w/o severe features.  Today she reports no complaints. Denies ha, visual changes, ruq/epigastric pain, n/v.  Contractions: Not present. Vag. Bleeding: None.  Movement: Present. denies leaking of fluid.  Review of Systems:   Pertinent items are noted in HPI Denies abnormal vaginal discharge w/ itching/odor/irritation, headaches, visual changes, shortness of breath, chest pain, abdominal pain, severe nausea/vomiting, or problems with urination or bowel movements unless otherwise stated above. Pertinent History Reviewed:  Reviewed past medical,surgical, social, obstetrical and family history.  Reviewed problem list, medications and allergies. Physical Assessment:   Vitals:   01/12/18 1356  BP: 122/78  Pulse: (!) 117  Weight: 295 lb (133.8 kg)  Body mass index is 46.2 kg/m.           Physical Examination:   General appearance: alert, well appearing, and in no distress  Mental status: alert, oriented to person, place, and time  Skin: warm & dry   Extremities: Edema: None    Cardiovascular: normal heart rate noted  Respiratory: normal respiratory effort, no distress  Abdomen: gravid, soft, non-tender  Pelvic: Cervical exam deferred         Fetal Status: Fetal Heart Rate (bpm): 155 Fundal Height: 37 cm Movement: Present    Fetal Surveillance Testing today: NST: FHR baseline 155 bpm, Variability: moderate, Accelerations:present, Decelerations:  Absent= Cat 1/Reactive Toco: none     Results for orders placed or performed in visit on 01/12/18 (from the past 24 hour(s))  POCT Urinalysis Dipstick   Collection Time:  01/12/18  2:25 PM  Result Value Ref Range   Color, UA     Clarity, UA     Glucose, UA neg    Bilirubin, UA     Ketones, UA neg    Spec Grav, UA  1.010 - 1.025   Blood, UA neg    pH, UA  5.0 - 8.0   Protein, UA neg    Urobilinogen, UA  0.2 or 1.0 E.U./dL   Nitrite, UA neg    Leukocytes, UA Trace (A) Negative   Appearance     Odor    Results for orders placed or performed in visit on 01/09/18 (from the past 24 hour(s))  Protein, urine, 24 hour   Collection Time: 01/11/18  4:00 PM  Result Value Ref Range   Protein, Ur 14.3 Not Estab. mg/dL   Protein, 40J24H Urine 811322 (H) 30 - 150 mg/24 hr    Assessment & Plan:  1) High-risk pregnancy G3P1011 at 1635w2d with an Estimated Date of Delivery: 02/28/18   2) CHTN w/ ? Superimposed pre-e w/o severe features, bp has been normal entire pregnancy off meds until elevated slightly 3/14, got pre-e labs, p:c ratio was 0.356 (baseline at 16wks was 0.095), 24hr urine 322, urine culture was neg, asymptomatic, bp's have been normal since. Already in 2x/wk [facility]- will switch to nst alt w/ bpp/dopp. Reviewed pre-e s/s, reasons to seek care  Meds: No orders of the defined types were placed in this encounter.   Labs/procedures today: nst  Treatment Plan:  Growth u/s q 3-4wks     2x/wk testing nst alt w/  bpp/dopp         IOL @ 37wks  Reviewed: Preterm labor symptoms and general obstetric precautions including but not limited to vaginal bleeding, contractions, leaking of fluid and fetal movement were reviewed in detail with the patient.  All questions were answered.  Follow-up: Return for As scheduled Mon for HROB, change from NST to efw/bpp/dopp u/s.  Orders Placed This Encounter  Procedures  . US OB Follow Up  . US FETAL BPP WO NON STRESS  . [place] UA Cord Doppler  . POCT Urinalysis Dipstick   Cheral Marker CNM, [facility] 01/12/2018 3:00 PM

## 2018-01-13 ENCOUNTER — Other Ambulatory Visit: Payer: Self-pay | Admitting: Women's Health

## 2018-01-13 DIAGNOSIS — O119 Pre-existing hypertension with pre-eclampsia, unspecified trimester: Secondary | ICD-10-CM

## 2018-01-13 DIAGNOSIS — O0993 Supervision of high risk pregnancy, unspecified, third trimester: Secondary | ICD-10-CM

## 2018-01-16 ENCOUNTER — Encounter: Payer: Self-pay | Admitting: Obstetrics & Gynecology

## 2018-01-16 ENCOUNTER — Other Ambulatory Visit: Payer: Medicaid Other | Admitting: Obstetrics and Gynecology

## 2018-01-16 ENCOUNTER — Ambulatory Visit (INDEPENDENT_AMBULATORY_CARE_PROVIDER_SITE_OTHER): Payer: Medicaid Other | Admitting: Obstetrics & Gynecology

## 2018-01-16 ENCOUNTER — Ambulatory Visit (INDEPENDENT_AMBULATORY_CARE_PROVIDER_SITE_OTHER): Payer: Medicaid Other

## 2018-01-16 VITALS — BP 122/70 | HR 126 | Wt 295.5 lb

## 2018-01-16 DIAGNOSIS — Z331 Pregnant state, incidental: Secondary | ICD-10-CM

## 2018-01-16 DIAGNOSIS — O0990 Supervision of high risk pregnancy, unspecified, unspecified trimester: Secondary | ICD-10-CM

## 2018-01-16 DIAGNOSIS — Z1389 Encounter for screening for other disorder: Secondary | ICD-10-CM

## 2018-01-16 DIAGNOSIS — O10913 Unspecified pre-existing hypertension complicating pregnancy, third trimester: Secondary | ICD-10-CM

## 2018-01-16 DIAGNOSIS — O10919 Unspecified pre-existing hypertension complicating pregnancy, unspecified trimester: Secondary | ICD-10-CM

## 2018-01-16 DIAGNOSIS — Z3A33 33 weeks gestation of pregnancy: Secondary | ICD-10-CM

## 2018-01-16 DIAGNOSIS — O119 Pre-existing hypertension with pre-eclampsia, unspecified trimester: Secondary | ICD-10-CM

## 2018-01-16 DIAGNOSIS — O0993 Supervision of high risk pregnancy, unspecified, third trimester: Secondary | ICD-10-CM

## 2018-01-16 LAB — POCT URINALYSIS DIPSTICK
Glucose, UA: NEGATIVE
KETONES UA: NEGATIVE
Leukocytes, UA: NEGATIVE
NITRITE UA: NEGATIVE
PROTEIN UA: NEGATIVE

## 2018-01-16 NOTE — Progress Notes (Signed)
US 33+6 wks,cephalic,anterior pl gr 0,normal ovaries bilat,afi 17 cm,RI .58,.55,.61=43%,FHR 171 bpm,BPP 8/8,EFW 2461 g 64%

## 2018-01-16 NOTE — Progress Notes (Signed)
   HIGH-RISK PREGNANCY VISIT Patient name: Christie Morrow MRN [id number]  Date of birth: 05/05/1981 Chief Complaint:   High Risk Gestation (US today; + cramping; pain in vagina and tailbone)  History of Present Illness:   Christie GrimLanna G Tarte is a [age] y.o. 733P1011 female at 878w6d with an Estimated Date of Delivery: 02/28/18 being seen today for ongoing management of a high-risk pregnancy complicated by chronic HTN.  Today she reports pelvic pressure. Contractions: Not present. Vag. Bleeding: None.  Movement: Present. denies leaking of fluid.  Review of Systems:   Pertinent items are noted in HPI Denies abnormal vaginal discharge w/ itching/odor/irritation, headaches, visual changes, shortness of breath, chest pain, abdominal pain, severe nausea/vomiting, or problems with urination or bowel movements unless otherwise stated above. Pertinent History Reviewed:  Reviewed past medical,surgical, social, obstetrical and family history.  Reviewed problem list, medications and allergies. Physical Assessment:   Vitals:   01/16/18 1345  BP: 122/70  Pulse: (!) 126  Weight: 295 lb 8 oz (134 kg)  Body mass index is 46.28 kg/m.           Physical Examination:   General appearance: alert, well appearing, and in no distress  Mental status: alert, oriented to person, place, and time  Skin: warm & dry   Extremities: Edema: None    Cardiovascular: normal heart rate noted  Respiratory: normal respiratory effort, no distress  Abdomen: gravid, soft, non-tender  Pelvic: cx LTC, +pubo coccygeal ligament tenderness         Fetal Status:     Movement: Present    Fetal Surveillance Testing today: BPP 8/8   Results for orders placed or performed in visit on 01/16/18 (from the past 24 hour(s))  POCT urinalysis dipstick   Collection Time: 01/16/18  1:47 PM  Result Value Ref Range   Color, UA     Clarity, UA     Glucose, UA neg    Bilirubin, UA     Ketones, UA neg    Spec Grav, UA  1.010 - 1.025   Blood, UA  trace    pH, UA  5.0 - 8.0   Protein, UA neg    Urobilinogen, UA  0.2 or 1.0 E.U./dL   Nitrite, UA neg    Leukocytes, UA Negative Negative   Appearance     Odor      Assessment & Plan:  1) High-risk pregnancy G3P1011 at 1008w6d with an Estimated Date of Delivery: 02/28/18   2) CHTN, stable    Meds: No orders of the defined types were placed in this encounter.   Labs/procedures today: BPP 8/8  Treatment Plan:  Twice weekly testing, I would still not commit to 37 weeks delivery, BP today is 122/70 and protein is , if she is she will definitely declare  Reviewed: Preterm labor symptoms and general obstetric precautions including but not limited to vaginal bleeding, contractions, leaking of fluid and fetal movement were reviewed in detail with the patient.  All questions were answered.  Follow-up: Return in about 3 days (around 01/19/2018) for NST, HROB.  Orders Placed This Encounter  Procedures  . POCT urinalysis dipstick   Lazaro ArmsLuther H Eure CNM, [facility] 01/16/2018 2:19 PM

## 2018-01-19 ENCOUNTER — Encounter: Payer: Self-pay | Admitting: Obstetrics and Gynecology

## 2018-01-19 ENCOUNTER — Ambulatory Visit (INDEPENDENT_AMBULATORY_CARE_PROVIDER_SITE_OTHER): Payer: Medicaid Other | Admitting: Obstetrics and Gynecology

## 2018-01-19 VITALS — BP 130/60 | HR 120 | Wt 297.4 lb

## 2018-01-19 DIAGNOSIS — Z3A34 34 weeks gestation of pregnancy: Secondary | ICD-10-CM

## 2018-01-19 DIAGNOSIS — Z331 Pregnant state, incidental: Secondary | ICD-10-CM

## 2018-01-19 DIAGNOSIS — O10913 Unspecified pre-existing hypertension complicating pregnancy, third trimester: Secondary | ICD-10-CM

## 2018-01-19 DIAGNOSIS — O0990 Supervision of high risk pregnancy, unspecified, unspecified trimester: Secondary | ICD-10-CM

## 2018-01-19 DIAGNOSIS — Z1389 Encounter for screening for other disorder: Secondary | ICD-10-CM | POA: Diagnosis not present

## 2018-01-19 LAB — POCT URINALYSIS DIPSTICK
GLUCOSE UA: NEGATIVE
Ketones, UA: NEGATIVE
Leukocytes, UA: NEGATIVE
Nitrite, UA: NEGATIVE

## 2018-01-19 NOTE — Progress Notes (Signed)
HIGH-RISK PREGNANCY VISIT Patient name: Christie Morrow MRN [id number]  Date of birth: 24-Oct-1981 Chief Complaint:   High Risk Gestation (NST; seeing spots)  History of Present Illness:   Christie Morrow is a 37 y.o. 623P1011 female at 8561w2d with an Estimated Date of Delivery: 02/28/18 being seen today for ongoing management of a high-risk pregnancy [facility] w/ superimposed pre-e w/o severe features, also Body mass index is 46.58 kg/m.    Today she reports some spots in her vision (scotona) on and off for 2 weeks   Contractions: Not present. Vag. Bleeding: None.  Movement: Present. denies leaking of fluid.  Review of Systems:   Pertinent items are noted in HPI Denies abnormal vaginal discharge w/ itching/odor/irritation, headaches, visual changes, shortness of breath, chest pain, abdominal pain, severe nausea/vomiting, or problems with urination or bowel movements unless otherwise stated above. Pertinent History Reviewed:  Reviewed past medical,surgical, social, obstetrical and family history.  Reviewed problem list, medications and allergies. Physical Assessment:   Vitals:   01/19/18 1424  BP: 130/60  Pulse: (!) 120  Weight: 297 lb 6.4 oz (134.9 kg)  Body mass index is 46.58 kg/m.           Physical Examination:   General appearance: alert, well appearing, and in no distress and oriented to person, place, and time  Mental status: alert, oriented to person, place, and time  Skin: warm & dry   Extremities: Edema: Trace    Cardiovascular: normal heart rate noted  Respiratory: normal respiratory effort, no distress  Abdomen: gravid, soft, non-tender  Pelvic: Cervical exam deferred         Fetal Status: Fetal Heart Rate (bpm): 160-185   Movement: Present    Fetal Surveillance Testing today: NST   Results for orders placed or performed in visit on 01/19/18 (from the past 24 hour(s))  POCT urinalysis dipstick   Collection Time: 01/19/18  2:25 PM  Result Value Ref Range     Color, UA     Clarity, UA     Glucose, UA neg    Bilirubin, UA     Ketones, UA neg    Spec Grav, UA  1.010 - 1.025   Blood, UA trace    pH, UA  5.0 - 8.0   Protein, UA trace    Urobilinogen, UA  0.2 or 1.0 E.U./dL   Nitrite, UA neg    Leukocytes, UA Negative Negative   Appearance     Odor      Assessment & Plan:  1) High-risk pregnancy G3P1011 at 5861w2d with an Estimated Date of Delivery: 02/28/18   2) CHTN w/ ? Superimposed pre-e w/o severe features, stable, scotona on and off for 2 weeks  Meds: No orders of the defined types were placed in this encounter.   Labs/procedures today: NST  Treatment Plan:  Growth u/s q 3-4wks     2x/wk testing nst alt w/ bpp/dopp         IOL @ 37wks  Reviewed: Preterm labor symptoms and general obstetric precautions including but not limited to vaginal bleeding, contractions, leaking of fluid and fetal movement were reviewed in detail with the patient.  All questions were answered.  Follow-up: Return in about 4 days (around 01/23/2018), or if symptoms worsen or fail to improve, for HROB, U/S: BPP.  Orders Placed This Encounter  Procedures  . POCT urinalysis dipstick     By signing my name below, I, Izna Ahmed, attest that this documentation has  been prepared under the direction and in the presence of Tilda Burrow, MD. Electronically Signed: Redge Gainer, Medical Scribe. 01/19/18. 3:20 PM.  I personally performed the services described in this documentation, which was SCRIBED in my presence. The recorded information has been reviewed and considered accurate. It has been edited as necessary during review. Tilda Burrow, MD

## 2018-01-20 ENCOUNTER — Other Ambulatory Visit: Payer: Self-pay | Admitting: Obstetrics and Gynecology

## 2018-01-20 DIAGNOSIS — O10919 Unspecified pre-existing hypertension complicating pregnancy, unspecified trimester: Secondary | ICD-10-CM

## 2018-01-23 ENCOUNTER — Encounter: Payer: Self-pay | Admitting: Obstetrics & Gynecology

## 2018-01-23 ENCOUNTER — Ambulatory Visit (INDEPENDENT_AMBULATORY_CARE_PROVIDER_SITE_OTHER): Payer: Medicaid Other | Admitting: Obstetrics & Gynecology

## 2018-01-23 ENCOUNTER — Ambulatory Visit (INDEPENDENT_AMBULATORY_CARE_PROVIDER_SITE_OTHER): Payer: Medicaid Other

## 2018-01-23 ENCOUNTER — Other Ambulatory Visit: Payer: Medicaid Other | Admitting: Obstetrics & Gynecology

## 2018-01-23 VITALS — BP 122/70 | HR 118 | Wt 297.0 lb

## 2018-01-23 DIAGNOSIS — Z1389 Encounter for screening for other disorder: Secondary | ICD-10-CM

## 2018-01-23 DIAGNOSIS — O10913 Unspecified pre-existing hypertension complicating pregnancy, third trimester: Secondary | ICD-10-CM

## 2018-01-23 DIAGNOSIS — O0990 Supervision of high risk pregnancy, unspecified, unspecified trimester: Secondary | ICD-10-CM

## 2018-01-23 DIAGNOSIS — Z3A34 34 weeks gestation of pregnancy: Secondary | ICD-10-CM | POA: Diagnosis not present

## 2018-01-23 DIAGNOSIS — Z331 Pregnant state, incidental: Secondary | ICD-10-CM | POA: Diagnosis not present

## 2018-01-23 DIAGNOSIS — O10919 Unspecified pre-existing hypertension complicating pregnancy, unspecified trimester: Secondary | ICD-10-CM

## 2018-01-23 LAB — POCT URINALYSIS DIPSTICK
Glucose, UA: NEGATIVE
Ketones, UA: NEGATIVE
LEUKOCYTES UA: NEGATIVE
NITRITE UA: NEGATIVE
PROTEIN UA: NEGATIVE
RBC UA: NEGATIVE

## 2018-01-23 NOTE — Progress Notes (Signed)
   HIGH-RISK PREGNANCY VISIT Patient name: Christie Morrow MRN [id number]  Date of birth: 04-14-1981 Chief Complaint:   Routine Prenatal Visit (US today)  History of Present Illness:   Christie Morrow is a 37 y.o. 613P1011 female at 5529w6d with an Estimated Date of Delivery: 02/28/18 being seen today for ongoing management of a high-risk pregnancy complicated by chronic HTN.  Today she reports backache. Contractions: Not present. Vag. Bleeding: None.  Movement: Present. denies leaking of fluid.  Review of Systems:   Pertinent items are noted in HPI Denies abnormal vaginal discharge w/ itching/odor/irritation, headaches, visual changes, shortness of breath, chest pain, abdominal pain, severe nausea/vomiting, or problems with urination or bowel movements unless otherwise stated above. Pertinent History Reviewed:  Reviewed past medical,surgical, social, obstetrical and family history.  Reviewed problem list, medications and allergies. Physical Assessment:   Vitals:   01/23/18 1330  BP: 122/70  Pulse: (!) 118  Weight: 297 lb (134.7 kg)  Body mass index is 46.52 kg/m.           Physical Examination:   General appearance: alert, well appearing, and in no distress  Mental status: alert, oriented to person, place, and time  Skin: warm & dry   Extremities: Edema: Trace    Cardiovascular: normal heart rate noted  Respiratory: normal respiratory effort, no distress  Abdomen: gravid, soft, non-tender  Pelvic: Cervical exam deferred         Fetal Status: Fetal Heart Rate (bpm): 169 Fundal Height: 40 cm Movement: Present    Fetal Surveillance Testing today: BPP 8/8   Results for orders placed or performed in visit on 01/23/18 (from the past 24 hour(s))  POCT Urinalysis Dipstick   Collection Time: 01/23/18  1:33 PM  Result Value Ref Range   Color, UA     Clarity, UA     Glucose, UA neg    Bilirubin, UA     Ketones, UA neg    Spec Grav, UA  1.010 - 1.025   Blood, UA neg    pH, UA  5.0 - 8.0     Protein, UA neg    Urobilinogen, UA  0.2 or 1.0 E.U./dL   Nitrite, UA neg    Leukocytes, UA Negative Negative   Appearance     Odor      Assessment & Plan:  1) High-risk pregnancy G3P1011 at 4429w6d with an Estimated Date of Delivery: 02/28/18   2) CHTN, stable, ressuring sonogram surveillance today    Meds: No orders of the defined types were placed in this encounter.   Labs/procedures today: BPP 8/8 with excellent Doppler flow  Treatment Plan:  Continue to evaluate for delivery timing  Reviewed: Preterm labor symptoms and general obstetric precautions including but not limited to vaginal bleeding, contractions, leaking of fluid and fetal movement were reviewed in detail with the patient.  All questions were answered.  Follow-up: Return in about 3 days (around 01/26/2018) for NST, HROB.  Orders Placed This Encounter  Procedures  . POCT Urinalysis Dipstick   Lazaro ArmsLuther H Johanan Skorupski  01/23/2018 2:25 PM

## 2018-01-23 NOTE — Progress Notes (Signed)
US 34+6 wks,cephalic,BPP 8/8,anterior pl gr 0,fhr 169 bpm,afi 20 cm,normal ovaries bilat, RI .59,.63=63%

## 2018-01-26 ENCOUNTER — Other Ambulatory Visit: Payer: Medicaid Other | Admitting: Advanced Practice Midwife

## 2018-01-27 ENCOUNTER — Other Ambulatory Visit: Payer: Self-pay | Admitting: Obstetrics and Gynecology

## 2018-01-27 DIAGNOSIS — O10919 Unspecified pre-existing hypertension complicating pregnancy, unspecified trimester: Secondary | ICD-10-CM

## 2018-01-30 ENCOUNTER — Other Ambulatory Visit: Payer: Self-pay

## 2018-01-30 ENCOUNTER — Ambulatory Visit (INDEPENDENT_AMBULATORY_CARE_PROVIDER_SITE_OTHER): Payer: Medicaid Other

## 2018-01-30 ENCOUNTER — Ambulatory Visit (INDEPENDENT_AMBULATORY_CARE_PROVIDER_SITE_OTHER): Payer: Medicaid Other | Admitting: Obstetrics and Gynecology

## 2018-01-30 ENCOUNTER — Encounter: Payer: Self-pay | Admitting: Obstetrics and Gynecology

## 2018-01-30 VITALS — BP 112/70 | HR 118 | Wt 300.0 lb

## 2018-01-30 DIAGNOSIS — Z3A35 35 weeks gestation of pregnancy: Secondary | ICD-10-CM

## 2018-01-30 DIAGNOSIS — O10913 Unspecified pre-existing hypertension complicating pregnancy, third trimester: Secondary | ICD-10-CM | POA: Diagnosis not present

## 2018-01-30 DIAGNOSIS — Z1389 Encounter for screening for other disorder: Secondary | ICD-10-CM

## 2018-01-30 DIAGNOSIS — Z331 Pregnant state, incidental: Secondary | ICD-10-CM | POA: Diagnosis not present

## 2018-01-30 DIAGNOSIS — O10919 Unspecified pre-existing hypertension complicating pregnancy, unspecified trimester: Secondary | ICD-10-CM

## 2018-01-30 DIAGNOSIS — O0993 Supervision of high risk pregnancy, unspecified, third trimester: Secondary | ICD-10-CM

## 2018-01-30 DIAGNOSIS — O0990 Supervision of high risk pregnancy, unspecified, unspecified trimester: Secondary | ICD-10-CM

## 2018-01-30 LAB — POCT URINALYSIS DIPSTICK
Blood, UA: NEGATIVE
Glucose, UA: NEGATIVE
Ketones, UA: NEGATIVE
LEUKOCYTES UA: NEGATIVE
NITRITE UA: NEGATIVE

## 2018-01-30 NOTE — Progress Notes (Signed)
Patient ID: Christie Morrow, female   DOB: Oct 23, 1981, 37 y.o.   MRN: [id number]   [facility] Patient name: Christie Morrow MRN [id number]  Date of birth: Oct 23, 1981 Chief Complaint:   High Risk Gestation (u/s today)  History of Present Illness:   Christie Morrow is a 37 y.o. 513P1011 female at 3233w6d with an Estimated Date of Delivery: 02/28/18 being seen today for ongoing management of a high-risk pregnancy complicated by chronic HTN.  Today she reports no complaints. Contractions: Not present. Vag. Bleeding: None.  Movement: Present. denies leaking of fluid.  Review of Systems:   Pertinent items are noted in HPI Denies abnormal vaginal discharge w/ itching/odor/irritation, headaches, visual changes, shortness of breath, chest pain, abdominal pain, severe nausea/vomiting, or problems with urination or bowel movements unless otherwise stated above. Pertinent History Reviewed:  Reviewed past medical,surgical, social, obstetrical and family history.  Reviewed problem list, medications and allergies. Physical Assessment:   Vitals:   01/30/18 1426  BP: 112/70  Pulse: (!) 118  Weight: 300 lb (136.1 kg)  Body mass index is 46.99 kg/m.           Physical Examination:   General appearance: alert, well appearing, and in no distress, oriented to person, place, and time and overweight  Mental status: alert, oriented to person, place, and time, normal mood, behavior, speech, dress, motor activity, and thought processes, affect appropriate to mood  Skin: warm & dry   Extremities: Edema: Trace    Cardiovascular: normal heart rate noted  Respiratory: normal respiratory effort, no distress  Abdomen: gravid, soft, non-tender  Pelvic: Cervical exam deferred         Fetal Status: Fetal Heart Rate (bpm): 164 u/s Fundal Height: 38 cm Movement: Present    Fetal Surveillance Testing today: U/S:US 35+6 cephalic,BPP 8/8,FHR 164 bpm,anterior pl gr 0,afi 21 cm,RI .56,.57=47%, S/D 2.3= 41%,bilat  adnexa's wnl   Results for orders placed or performed in visit on 01/30/18 (from the past 24 hour(s))  POCT urinalysis dipstick   Collection Time: 01/30/18  2:27 PM  Result Value Ref Range   Color, UA     Clarity, UA     Glucose, UA neg    Bilirubin, UA     Ketones, UA neg    Spec Grav, UA  1.010 - 1.025   Blood, UA neg    pH, UA  5.0 - 8.0   Protein, UA 1+    Urobilinogen, UA  0.2 or 1.0 E.U./dL   Nitrite, UA neg    Leukocytes, UA Negative Negative   Appearance     Odor      Assessment & Plan:  1) High-risk pregnancy G3P1011 at 7833w6d with an Estimated Date of Delivery: 02/28/18   2) CHTN, stable  Meds: No orders of the defined types were placed in this encounter.   Labs/procedures today: U/S  Treatment Plan:  Twice weekly Testing, alternating BPP and NST  Follow-up: Return in about 3 days (around 02/02/2018) for HROB, NST.  Orders Placed This Encounter  Procedures  . Protein / creatinine ratio, urine  . Protein, urine, 24 hour  . POCT urinalysis dipstick   By signing my name below, I, Diona BrownerJennifer Gorman, attest that this documentation has been prepared under the direction and in the presence of Tilda BurrowFerguson, Tahjae Durr V, MD. Electronically Signed: Diona BrownerJennifer Gorman, Medical Scribe. 01/30/18. 2:40 PM.  I personally performed the services described in this documentation, which was SCRIBED in my presence. The recorded information has been  reviewed and considered accurate. It has been edited as necessary during review. Jonnie Kind, MD

## 2018-01-30 NOTE — Progress Notes (Signed)
US 35+6 cephalic,BPP 8/8,FHR 164 bpm,anterior pl gr 0,afi 21 cm,RI .56,.57=47%, S/D 2.3= 41%,bilat adnexa's wnl

## 2018-02-02 ENCOUNTER — Ambulatory Visit (INDEPENDENT_AMBULATORY_CARE_PROVIDER_SITE_OTHER): Payer: Medicaid Other | Admitting: Women's Health

## 2018-02-02 ENCOUNTER — Encounter: Payer: Self-pay | Admitting: Women's Health

## 2018-02-02 ENCOUNTER — Inpatient Hospital Stay (HOSPITAL_COMMUNITY)
Admission: AD | Admit: 2018-02-02 | Discharge: 2018-02-05 | DRG: 806 | Disposition: A | Discharge status: Home / Self Care | Payer: Medicaid Other | Source: Ambulatory Visit | Attending: Obstetrics & Gynecology | Admitting: Obstetrics & Gynecology

## 2018-02-02 VITALS — BP 120/80 | HR 96 | Wt 300.0 lb

## 2018-02-02 DIAGNOSIS — O9902 Anemia complicating childbirth: Secondary | ICD-10-CM | POA: Diagnosis present

## 2018-02-02 DIAGNOSIS — O0990 Supervision of high risk pregnancy, unspecified, unspecified trimester: Secondary | ICD-10-CM

## 2018-02-02 DIAGNOSIS — Z6791 Unspecified blood type, Rh negative: Secondary | ICD-10-CM

## 2018-02-02 DIAGNOSIS — O119 Pre-existing hypertension with pre-eclampsia, unspecified trimester: Secondary | ICD-10-CM | POA: Diagnosis present

## 2018-02-02 DIAGNOSIS — K219 Gastro-esophageal reflux disease without esophagitis: Secondary | ICD-10-CM | POA: Diagnosis present

## 2018-02-02 DIAGNOSIS — A6000 Herpesviral infection of urogenital system, unspecified: Secondary | ICD-10-CM | POA: Diagnosis present

## 2018-02-02 DIAGNOSIS — D649 Anemia, unspecified: Secondary | ICD-10-CM | POA: Diagnosis present

## 2018-02-02 DIAGNOSIS — O134 Gestational [pregnancy-induced] hypertension without significant proteinuria, complicating childbirth: Secondary | ICD-10-CM | POA: Diagnosis not present

## 2018-02-02 DIAGNOSIS — O10913 Unspecified pre-existing hypertension complicating pregnancy, third trimester: Secondary | ICD-10-CM

## 2018-02-02 DIAGNOSIS — Z1389 Encounter for screening for other disorder: Secondary | ICD-10-CM

## 2018-02-02 DIAGNOSIS — R51 Headache: Secondary | ICD-10-CM | POA: Diagnosis not present

## 2018-02-02 DIAGNOSIS — F418 Other specified anxiety disorders: Secondary | ICD-10-CM | POA: Diagnosis present

## 2018-02-02 DIAGNOSIS — O1493 Unspecified pre-eclampsia, third trimester: Secondary | ICD-10-CM | POA: Diagnosis not present

## 2018-02-02 DIAGNOSIS — O99344 Other mental disorders complicating childbirth: Secondary | ICD-10-CM | POA: Diagnosis present

## 2018-02-02 DIAGNOSIS — O9832 Other infections with a predominantly sexual mode of transmission complicating childbirth: Secondary | ICD-10-CM | POA: Diagnosis present

## 2018-02-02 DIAGNOSIS — O26893 Other specified pregnancy related conditions, third trimester: Secondary | ICD-10-CM | POA: Diagnosis present

## 2018-02-02 DIAGNOSIS — O9989 Other specified diseases and conditions complicating pregnancy, childbirth and the puerperium: Secondary | ICD-10-CM

## 2018-02-02 DIAGNOSIS — Z3483 Encounter for supervision of other normal pregnancy, third trimester: Secondary | ICD-10-CM

## 2018-02-02 DIAGNOSIS — Z331 Pregnant state, incidental: Secondary | ICD-10-CM | POA: Diagnosis not present

## 2018-02-02 DIAGNOSIS — O1002 Pre-existing essential hypertension complicating childbirth: Secondary | ICD-10-CM | POA: Diagnosis present

## 2018-02-02 DIAGNOSIS — O10919 Unspecified pre-existing hypertension complicating pregnancy, unspecified trimester: Secondary | ICD-10-CM

## 2018-02-02 DIAGNOSIS — O99214 Obesity complicating childbirth: Secondary | ICD-10-CM | POA: Diagnosis present

## 2018-02-02 DIAGNOSIS — O114 Pre-existing hypertension with pre-eclampsia, complicating childbirth: Secondary | ICD-10-CM | POA: Diagnosis present

## 2018-02-02 DIAGNOSIS — O9962 Diseases of the digestive system complicating childbirth: Secondary | ICD-10-CM | POA: Diagnosis present

## 2018-02-02 DIAGNOSIS — O0993 Supervision of high risk pregnancy, unspecified, third trimester: Secondary | ICD-10-CM

## 2018-02-02 DIAGNOSIS — Z3A36 36 weeks gestation of pregnancy: Secondary | ICD-10-CM

## 2018-02-02 LAB — TYPE AND SCREEN
ABO/RH(D): AB NEG
Antibody Screen: NEGATIVE

## 2018-02-02 LAB — POCT URINALYSIS DIPSTICK
Blood, UA: NEGATIVE
Glucose, UA: NEGATIVE
KETONES UA: NEGATIVE
Leukocytes, UA: NEGATIVE
NITRITE UA: NEGATIVE

## 2018-02-02 LAB — PROTEIN, URINE, 24 HOUR
PROTEIN UR: 20.7 mg/dL
Protein, 24H Urine: 642 mg/24 hr — ABNORMAL HIGH (ref 30–150)

## 2018-02-02 MED ORDER — OXYTOCIN 40 UNITS IN LACTATED RINGERS INFUSION - SIMPLE MED
INTRAVENOUS | Status: AC
  Filled 2018-02-02: qty 1000

## 2018-02-02 MED ORDER — HYDRALAZINE HCL 20 MG/ML IJ SOLN: 10.0000 mg | Freq: Once | INTRAMUSCULAR | Status: DC | PRN

## 2018-02-02 MED ORDER — PENICILLIN G POT IN DEXTROSE 60000 UNIT/ML IV SOLN
3.0000 10*6.[IU] | INTRAVENOUS | Status: DC
  Administered 2018-02-02 – 2018-02-03 (×4): 3 10*6.[IU] via INTRAVENOUS
  Filled 2018-02-02 (×8): qty 50

## 2018-02-02 MED ORDER — ACETAMINOPHEN 325 MG PO TABS: 650.0000 mg | ORAL_TABLET | ORAL | Status: DC | PRN

## 2018-02-02 MED ORDER — LACTATED RINGERS IV SOLN
INTRAVENOUS | Status: DC
  Administered 2018-02-02: 18:00:00 via INTRAVENOUS

## 2018-02-02 MED ORDER — ACETAMINOPHEN 500 MG PO TABS
1000.0000 mg | ORAL_TABLET | Freq: Four times a day (QID) | ORAL | Status: DC | PRN
  Administered 2018-02-02: 1000 mg via ORAL
  Filled 2018-02-02: qty 2

## 2018-02-02 MED ORDER — OXYTOCIN 40 UNITS IN LACTATED RINGERS INFUSION - SIMPLE MED
1.0000 m[IU]/min | INTRAVENOUS | Status: DC
  Administered 2018-02-02: 2 m[IU]/min via INTRAVENOUS
  Administered 2018-02-03: 30 m[IU]/min via INTRAVENOUS
  Filled 2018-02-02: qty 1000

## 2018-02-02 MED ORDER — SODIUM CHLORIDE 0.9 % IV SOLN: 5.0000 10*6.[IU] | Freq: Once | INTRAVENOUS | Status: DC

## 2018-02-02 MED ORDER — MAGNESIUM SULFATE BOLUS VIA INFUSION: 6.0000 g | Freq: Once | INTRAVENOUS | Status: DC

## 2018-02-02 MED ORDER — PENICILLIN G POT IN DEXTROSE 60000 UNIT/ML IV SOLN: 3.0000 10*6.[IU] | INTRAVENOUS | Status: DC

## 2018-02-02 MED ORDER — OXYTOCIN BOLUS FROM INFUSION: 500.0000 mL | Freq: Once | INTRAVENOUS | Status: DC

## 2018-02-02 MED ORDER — MAGNESIUM SULFATE 40 G IN LACTATED RINGERS - SIMPLE
2.0000 g/h | INTRAVENOUS | Status: DC
  Administered 2018-02-03: 2 g/h via INTRAVENOUS
  Filled 2018-02-02 (×2): qty 500

## 2018-02-02 MED ORDER — CYCLOBENZAPRINE HCL 10 MG PO TABS: 10.0000 mg | ORAL_TABLET | Freq: Three times a day (TID) | ORAL | 0 refills | Status: DC | PRN

## 2018-02-02 MED ORDER — OXYCODONE-ACETAMINOPHEN 5-325 MG PO TABS: 1.0000 | ORAL_TABLET | ORAL | Status: DC | PRN

## 2018-02-02 MED ORDER — FENTANYL CITRATE (PF) 100 MCG/2ML IJ SOLN
100.0000 ug | INTRAMUSCULAR | Status: DC | PRN
  Administered 2018-02-03: 100 ug via INTRAVENOUS
  Filled 2018-02-02: qty 2

## 2018-02-02 MED ORDER — TERBUTALINE SULFATE 1 MG/ML IJ SOLN: 0.2500 mg | Freq: Once | INTRAMUSCULAR | Status: DC | PRN

## 2018-02-02 MED ORDER — ONDANSETRON HCL 4 MG/2ML IJ SOLN: 4.0000 mg | Freq: Four times a day (QID) | INTRAMUSCULAR | Status: DC | PRN

## 2018-02-02 MED ORDER — BUTALBITAL-APAP-CAFFEINE 50-325-40 MG PO TABS
2.0000 | ORAL_TABLET | Freq: Four times a day (QID) | ORAL | Status: DC | PRN
  Administered 2018-02-03 (×2): 2 via ORAL
  Filled 2018-02-02 (×3): qty 2

## 2018-02-02 MED ORDER — LIDOCAINE HCL (PF) 1 % IJ SOLN: 30.0000 mL | INTRAMUSCULAR | Status: DC | PRN

## 2018-02-02 MED ORDER — LACTATED RINGERS IV SOLN: 500.0000 mL | INTRAVENOUS | Status: DC | PRN

## 2018-02-02 MED ORDER — OXYCODONE-ACETAMINOPHEN 5-325 MG PO TABS: 2.0000 | ORAL_TABLET | ORAL | Status: DC | PRN

## 2018-02-02 MED ORDER — BUPROPION HCL ER (XL) 300 MG PO TB24
300.0000 mg | ORAL_TABLET | Freq: Every day | ORAL | Status: DC
  Administered 2018-02-02 – 2018-02-04 (×3): 300 mg via ORAL
  Filled 2018-02-02 (×5): qty 1

## 2018-02-02 MED ORDER — MAGNESIUM SULFATE 40 G IN LACTATED RINGERS - SIMPLE: 2.0000 g/h | INTRAVENOUS | Status: DC

## 2018-02-02 MED ORDER — OXYTOCIN 40 UNITS IN LACTATED RINGERS INFUSION - SIMPLE MED: 2.5000 [IU]/h | INTRAVENOUS | Status: DC

## 2018-02-02 MED ORDER — DULOXETINE HCL 60 MG PO CPEP
60.0000 mg | ORAL_CAPSULE | Freq: Every day | ORAL | Status: DC
  Administered 2018-02-02 – 2018-02-04 (×3): 60 mg via ORAL
  Filled 2018-02-02 (×5): qty 1

## 2018-02-02 MED ORDER — SODIUM CHLORIDE 0.9 % IV SOLN
5.0000 10*6.[IU] | Freq: Once | INTRAVENOUS | Status: AC
  Administered 2018-02-02: 5 10*6.[IU] via INTRAVENOUS
  Filled 2018-02-02: qty 5

## 2018-02-02 MED ORDER — BETAMETHASONE SOD PHOS & ACET 6 (3-3) MG/ML IJ SUSP
12.0000 mg | INTRAMUSCULAR | Status: DC
  Administered 2018-02-02: 12 mg via INTRAMUSCULAR
  Filled 2018-02-02 (×2): qty 2

## 2018-02-02 MED ORDER — MAGNESIUM SULFATE BOLUS VIA INFUSION
4.0000 g | Freq: Once | INTRAVENOUS | Status: AC
  Administered 2018-02-02: 4 g via INTRAVENOUS
  Filled 2018-02-02: qty 500

## 2018-02-02 MED ORDER — SOD CITRATE-CITRIC ACID 500-334 MG/5ML PO SOLN: 30.0000 mL | ORAL | Status: DC | PRN

## 2018-02-02 MED ORDER — LACTATED RINGERS IV SOLN
INTRAVENOUS | Status: DC
  Administered 2018-02-03 (×2): via INTRAVENOUS

## 2018-02-02 MED ORDER — LABETALOL HCL 5 MG/ML IV SOLN: 20.0000 mg | INTRAVENOUS | Status: DC | PRN

## 2018-02-02 MED ORDER — FLEET ENEMA 7-19 GM/118ML RE ENEM: 1.0000 | ENEMA | RECTAL | Status: DC | PRN

## 2018-02-02 NOTE — Progress Notes (Signed)
LABOR PROGRESS NOTE  Christie Morrow is a 37 y.o. G3P1011 at 1833w2d  admitted for IOL for cHTN w/ SIPE  Subjective: Patient reports frontal headache. No associated symptoms.   Objective: BP 133/61   Pulse (!) 107   Temp 98.3 F (36.8 C) (Oral)   Resp 16   Ht 5\' 7"  (1.702 m)   Wt 300 lb (136.1 kg)   LMP 05/24/2017 (Exact Date)   SpO2 98%   BMI 46.99 kg/m  or  Vitals:   02/02/18 2232 02/02/18 2300 02/02/18 2303 02/02/18 2330  BP: 134/75  133/65 133/61  Pulse: (!) 114  (!) 116 (!) 107  Resp: 16 18 16 16   Temp:  98.3 F (36.8 C)    TempSrc:  Oral    SpO2:      Weight:      Height:        Last SVE Dilation: 3 Effacement (%): 60 Cervical Position: Posterior Station: -3 Presentation: Vertex Exam by:: Virl CageyBrooke Boyer, RN FHT: baseline rate 140, moderate varibility, +acel, no decel Toco: ctx q 2-4 min  Total I/O In: 1229.3 [P.O.:680; I.V.:449.3; IV Piggyback:100] Out: 300 [Urine:300]   Assessment / Plan: 37 y.o. G3P1011 at 8633w2d [facility]  Preeclampsia: On IV Mag. No signs/sx of toxicity. Monitor UOP. Tylenol given for headache. Labor: On IV Pitocin, still latent. Continue to titrate Fetal Wellbeing:  Cat I Pain Control:   Per patient's request Anticipated MOD:  SVD Hx PPH: Plan to give Pitocin and misoprostol pospartum   Frederik PearJulie P Degele, MD 02/02/2018, 11:52 PM

## 2018-02-02 NOTE — H&P (Addendum)
OBSTETRIC ADMISSION HISTORY AND PHYSICAL  Christie Morrow is a 37 y.o. female G3P1011 with IUP at 5064w2d by LMP presenting for IOL for superimposed Pre-E on cHTN. Patient was not on medications during pregnancy. cHTN well controlled during third trimester. 24hr urine protein 642 as well as one week history of floaters and headache. Direct admit from clinic for superimposed pre-E with severe features. BP 120/80 at clinic. Patient was not on any blood pressure medication prior to first child. After first child developed edema and was started on lasix 40mg  daily. 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 No recent outbreaks and no prodromal symptoms.   First birth complicated by post-partum hemorrhage. There were reported non-reassuring fetal heart tones and per reports C-Section was considered. Was able to deliver vaginally.  She plans on breast feeding, although eventually had to switch to bottle with first child. Desired IUD for contraception. [facility]  Initiated Care at  10wk  FOB Armanda MagicBradley Tanimoto  Dating By LMP c/w 1st trimester U/S 7wk  Pap 01/10/17 negative w/ +[facility]PV [facility], needs repeat pap after 01/10/18:  GC/CT Initial:  -/-              36+wks:  Genetic Screen NT/IT: neg  CF screen declined  Anatomic US Normal female, CP cyst, resolved at 2643w2d   Flu vaccine 08/08/17   Tdap Recommended ~ 28wks  Glucose Screen  2 hr normal: 84/134/102  GBS   Feed Preference breast  Contraception IUD  Circumcision n/a  Childbirth Classes declined  Pediatrician List given    Dating: By LMP --->  Estimated Date of Delivery: 02/28/18  Sono:   @[redacted]w[redacted]d , CWD, normal anatomy, cephalic presentation US 33+6 wks,cephalic,anterior pl gr 0,normal ovaries bilat,afi 17 cm,RI .58,.55,.61=43%,FHR 171 bpm,BPP 8/8,EFW 2461 g 64%  Prenatal History/Complications: Patient Active Problem List   Diagnosis Date Noted  .  Pre-eclampsia superimposed on chronic hypertension 02/02/2018  . Preeclampsia 01/06/2018  . Cervical high risk human papillomavirus (HPV) DNA test positive 08/18/2017  . Rh negative state in antepartum period 08/10/2017  . Marijuana use 08/10/2017  . Supervision of high risk pregnancy, antepartum 08/08/2017  . Chronic hypertension during pregnancy, antepartum 08/08/2017  . 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 History of postpartum hemorrhage, currently pregnant 08/08/2017  . Asymptomatic bacteriuria during pregnancy in first trimester 08/08/2017  . Depression with anxiety 12/31/2016  . Chronic pain of both knees 12/31/2016  . Gastroesophageal reflux disease without esophagitis 12/31/2016  . Fibromyalgia 10/06/2015  . Essential hypertension 10/06/2015  . Depression 10/06/2015  . Insomnia 10/06/2015  . Morbid obesity (HCC) 10/06/2015  . Venous stasis 10/06/2015    Past Medical History: Past Medical History:  Diagnosis Date  . Arthritis   . Disc disorder   . Fibromyalgia   . Mental disorder    depression/anxiety  . Vaginal Pap smear, abnormal     Past Surgical History: Past Surgical History:  Procedure Laterality Date  . CRYOTHERAPY    . FLEXIBLE SIGMOIDOSCOPY  06/11/2011   Procedure: FLEXIBLE SIGMOIDOSCOPY;  Surgeon: Malissa HippoNajeeb U Rehman, MD;  Location: [facility];  Service: Endoscopy;  Laterality: N/A;  3:30  . WISDOM TOOTH EXTRACTION  2003    Obstetrical History: OB History    Gravida  3   Para  1   Term  1   Preterm      AB  1   Living  1  SAB  1   TAB      Ectopic      Multiple      Live Births  1           Social History: Social History   Socioeconomic History  . Marital status: Married    Spouse name: Not on file  . Number of children: Not on file  . Years of education: Not on file  . Highest education level: Not on file  Occupational History  . Not on file  Social Needs  . Financial resource strain: Not on file  . Food insecurity:     Worry: Not on file    Inability: Not on file  . Transportation needs:    Medical: Not on file    Non-medical: Not on file  Tobacco Use  . Smoking status: Never Smoker  . Smokeless tobacco: Never Used  Substance and Sexual Activity  . Alcohol use: No  . Drug use: No  . Sexual activity: Yes    Birth control/protection: None  Lifestyle  . Physical activity:    Days per week: Not on file    Minutes per session: Not on file  . Stress: Not on file  Relationships  . Social connections:    Talks on phone: Not on file    Gets together: Not on file    Attends religious service: Not on file    Active member of club or organization: Not on file    Attends meetings of clubs or organizations: Not on file    Relationship status: Not on file  Other Topics Concern  . Not on file  Social History Narrative  . Not on file    Family History: Family History  Problem Relation Age of Onset  . Heart disease Father   . Multiple sclerosis Father   . Stroke Father   . Cancer Maternal Grandmother        breast  . Heart disease Maternal Grandmother   . Heart disease Maternal Grandfather   . Heart disease Paternal Grandmother   . Heart disease Paternal Grandfather     Allergies: No Known Allergies  Medications Prior to Admission  Medication Sig Dispense Refill Last Dose  . BABY ASPIRIN PO Take by mouth daily.   Taking  . buPROPion (WELLBUTRIN XL) 300 MG 24 hr tablet Take 1 tablet (300 mg total) by mouth daily. 30 tablet 6 Taking  . cyclobenzaprine (FLEXERIL) 10 MG tablet Take 1 tablet (10 mg total) by mouth every 8 (eight) hours as needed for muscle spasms. 30 tablet 0   . DULoxetine (CYMBALTA) 60 MG capsule Take 1 capsule (60 mg total) by mouth daily. 30 capsule 6 Taking  . omeprazole (PRILOSEC) 20 MG capsule Take 1 capsule (20 mg total) by mouth daily. 30 capsule 6 Taking  . Prenatal Vit-Fe Fumarate-FA (MULTIVITAMIN-PRENATAL) 27-0.8 MG TABS tablet Take 1 tablet by mouth daily at 12 noon.    Taking  . valACYclovir (VALTREX) 500 MG tablet Take 1 tablet (500 mg total) by mouth 2 (two) times daily. 60 tablet 6 Taking     Review of Systems   All systems reviewed and negative except as stated in HPI  Blood pressure (!) 144/85, pulse (!) 137, temperature 98 F (36.7 C), temperature source Oral, resp. rate 16, height 5\' 7"  (1.702 m), weight 136.1 kg (300 lb), last menstrual period 05/24/2017, SpO2 98 %. General appearance: alert and cooperative Lungs: clear to auscultation bilaterally Heart: regular rate and rhythm  Abdomen: soft, non-tender; bowel sounds normal Extremities: Homans sign is negative, no sign of DVT DTR's normal reflexes Presentation: cephalic Fetal monitoring:  Baseline: 145 bpm, Variability: Good {> 6 bpm), Accelerations: Non-reactive but appropriate for gestational age and Decelerations: Absent Uterine activityNone Dilation: 2 Effacement (%): 50 Station: Ballotable Exam by:: Ferne Coe RNC   Prenatal labs: ABO, Rh: AB/Negative/-- (10/15 1154) Antibody: Negative (02/07 0858) Rubella: 4.41 (10/15 1154) RPR: Non Reactive (02/07 0858)  HBsAg: Negative (10/15 1154)  HIV: Non Reactive (02/07 0858)  GBS:   UNKNOWN 2 hr Glucola normal Genetic screening  normal Anatomy US normal  Prenatal Transfer Tool  Maternal Diabetes: No Genetic Screening: Normal Maternal Ultrasounds/Referrals: Normal Fetal Ultrasounds or other Referrals:  None Maternal Substance Abuse:  No Significant Maternal Medications:  Meds include: Other: cymbalta, omeprazole, valacyclovir, wellbutrin, flexeril Significant Maternal Lab Results: Lab values include: Rh negative, Other: GBS unknown, 24 hour protein 642  Results for orders placed or performed in visit on 02/02/18 (from the past 24 hour(s))  POCT urinalysis dipstick   Collection Time: 02/02/18  1:55 PM  Result Value Ref Range   Color, UA     Clarity, UA     Glucose, UA neg    Bilirubin, UA     Ketones, UA neg    Spec Grav, UA   1.010 - 1.025   Blood, UA neg    pH, UA  5.0 - 8.0   Protein, UA trace    Urobilinogen, UA  0.2 or 1.0 E.U./dL   Nitrite, UA neg    Leukocytes, UA Negative Negative   Appearance     Odor      Patient Active Problem List   Diagnosis Date Noted  . Pre-eclampsia superimposed on chronic hypertension 02/02/2018  . Preeclampsia 01/06/2018  . Cervical high risk human papillomavirus (HPV) DNA test positive 08/18/2017  . Rh negative state in antepartum period 08/10/2017  . Marijuana use 08/10/2017  . Supervision of high risk pregnancy, antepartum 08/08/2017  . Chronic hypertension during pregnancy, antepartum 08/08/2017  . 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 History of postpartum hemorrhage, currently pregnant 08/08/2017  . Asymptomatic bacteriuria during pregnancy in first trimester 08/08/2017  . Depression with anxiety 12/31/2016  . Chronic pain of both knees 12/31/2016  . Gastroesophageal reflux disease without esophagitis 12/31/2016  . Fibromyalgia 10/06/2015  . Essential hypertension 10/06/2015  . Depression 10/06/2015  . Insomnia 10/06/2015  . Morbid obesity (HCC) 10/06/2015  . Venous stasis 10/06/2015    Assessment/Plan:  Christie Morrow is a 37 y.o. G3P1011 at [redacted]w[redacted]d here for IOL for superimposed severe pre-E. History of PP hemorrhage so will need to be careful as she is at increased risk.  #Labor: induction of labor with pitocin, 2x2 #cHTN w/ SIPE: Will start on mag, collect baseline labs.  #preterm: GBS unknown start antibiotics, BMZ  #Pain: Pain meds for now, epidural by request #FWB: Cat 1 although strip not tracing well #ID: PCN #MOF: breast #MOC: IUD  Myrene Buddy, MD  02/02/2018, 6:11 PM   OB FELLOW HISTORY AND PHYSICAL ATTESTATION  I confirm that I have verified the information documented in the resident's note and that I have also personally reperformed the physical exam and all medical decision making activities. I agree with above documentation and have made  edits as needed.   Caryl Ada, DO OB Fellow 02/02/2018, 7:04 PM

## 2018-02-02 NOTE — Progress Notes (Signed)
HIGH-RISK PREGNANCY VISIT Patient name: Christie Morrow Somes MRN [id number]  Date of birth: 25-Feb-1981 Chief Complaint:   Routine Prenatal Visit (NST/ gbs- gc- chl)  History of Present Illness:   Christie Morrow Doshi is a 37 y.o. 513P1011 female at 3854w2d with an Estimated Date of Delivery: 02/28/18 being seen today for ongoing management of a high-risk [facility] ?superimposed pre-e, not on meds. BPs normal every visit except 142/76 at 32wks, had 1+ proteinuria at that visit, P:C ratio returned 0.356, remainder of labs were normal. Then bp's returned to normal. 24hr urine yesterday was 642.   Today she reports Lt sided headaches for past week not resolved by apap, eventually go away when she sleeps, then return. Has been seeing floaters intermittently for past week. Had RUQ pain 1 day, then resolved. No n/v, but just doesn't feel well. Back has been hurting, requests refill on flexeril.  Contractions: Not present.  .  Movement: Present. denies leaking of fluid.  Review of Systems:   Pertinent items are noted in HPI Denies abnormal vaginal discharge w/ itching/odor/irritation, headaches, visual changes, shortness of breath, chest pain, abdominal pain, severe nausea/vomiting, or problems with urination or bowel movements unless otherwise stated above. Pertinent History Reviewed:  Reviewed past medical,surgical, social, obstetrical and family history.  Reviewed problem list, medications and allergies. Physical Assessment:   Vitals:   02/02/18 1349  BP: 120/80  Pulse: 96  Weight: 300 lb (136.1 kg)  Body mass index is 46.99 kg/m.           Physical Examination:   General appearance: alert, looks like she doesn't feel well  Mental status: alert, oriented to person, place, and time  Skin: warm & dry   Extremities: Edema: Trace    Cardiovascular: normal heart rate noted  Respiratory: normal respiratory effort, no distress  Abdomen: gravid, soft, non-tender  Pelvic: Cervical exam  performed  Dilation: 2.5 Effacement (%): Thick Station: Ballotable  Fetal Status: Fetal Heart Rate (bpm): 175 Fundal Height: 40 cm Movement: Present Presentation: Vertex  Fetal Surveillance Testing today: NST: FHR baseline 175 bpm, Variability: moderate, Accelerations:not present, Decelerations:  Absent= Cat 2/non-reactive Toco: none     Results for orders placed or performed in visit on 02/02/18 (from the past 24 hour(s))  POCT urinalysis dipstick   Collection Time: 02/02/18  1:55 PM  Result Value Ref Range   Color, UA     Clarity, UA     Glucose, UA neg    Bilirubin, UA     Ketones, UA neg    Spec Grav, UA  1.010 - 1.025   Blood, UA neg    pH, UA  5.0 - 8.0   Protein, UA trace    Urobilinogen, UA  0.2 or 1.0 E.U./dL   Nitrite, UA neg    Leukocytes, UA Negative Negative   Appearance     Odor      Assessment & Plan:  1) High-risk pregnancy G3P1011 at 6654w2d with an Estimated Date of Delivery: 02/28/18   2) CHTN w/ ? atypical superimposed pre-e, bp's normal, however 24hr urine 642, headaches unrelieved by apap, seeing floaters and doesn't feel well. To MAU for eval, notified Cleone Slimaroline Neill, CNM to expect her  Meds:  Meds ordered this encounter  Medications  . cyclobenzaprine (FLEXERIL) 10 MG tablet    Sig: Take 1 tablet (10 mg total) by mouth every 8 (eight) hours as needed for muscle spasms.    Dispense:  30 tablet  Refill:  0    Order Specific Question:   Supervising Provider    Answer:   Duane Lope H [[id number]]    Labs/procedures today: nst, sve, gbs, gc/ct  Treatment Plan:  To MAU for eval  Reviewed: Preterm labor symptoms and general obstetric precautions including but not limited to vaginal bleeding, contractions, leaking of fluid and fetal movement were reviewed in detail with the patient.  All questions were answered.  Follow-up: Return for As scheduled Mon for HROB, BPP/Dopp U/S if d/c'd from hospital.  Orders Placed This Encounter  Procedures  . Strep Gp B  NAA  . GC/Chlamydia Probe Amp  . POCT urinalysis dipstick   Cheral Marker CNM, [facility] 02/02/2018 5:17 PM

## 2018-02-02 NOTE — Patient Instructions (Addendum)
Christie Morrow, I greatly value your feedback.  If you receive a survey following your visit with us today, we appreciate you taking the time to fill it out.  Thanks, Christie Morrow, CNM, WHNP-BC   Call the office [phone]) or go to [facility] if:  You begin to have strong, frequent contractions  Your water breaks.  Sometimes it is a big gush of fluid, sometimes it is just a trickle that keeps getting your panties wet or running down your legs  You have vaginal bleeding.  It is normal to have a small amount of spotting if your cervix was checked.   You don't feel your baby moving like normal.  If you don't, get you something to eat and drink and lay down and focus on feeling your baby move.  You should feel at least 10 movements in 2 hours.  If you don't, you should call the office or go to [facility].   Call the office [phone]) or go to [facility] for these signs of pre-eclampsia:  Severe headache that does not go away with Tylenol  Visual changes- seeing spots, double, blurred vision  Pain under your right breast or upper abdomen that does not go away with Tums or heartburn medicine  Nausea and/or vomiting  Severe swelling in your hands, feet, and face        Preterm Labor and Birth Information The normal length of a pregnancy is 39-41 weeks. Preterm labor is when labor starts before 37 completed weeks of pregnancy. What are the risk factors for preterm labor? Preterm labor is more likely to occur in women who:  Have certain infections during pregnancy such as a bladder infection, sexually transmitted infection, or infection inside the uterus (chorioamnionitis).  Have a shorter-than-normal cervix.  Have gone into preterm labor before.  Have had surgery on their cervix.  Are younger than age 35 or older than age 35.  Are African American.  Are pregnant with twins or multiple babies (multiple gestation).  Take street drugs or smoke while pregnant.  Do  not gain enough weight while pregnant.  Became pregnant shortly after having been pregnant.  What are the symptoms of preterm labor? Symptoms of preterm labor include:  Cramps similar to those that can happen during a menstrual period. The cramps may happen with diarrhea.  Pain in the abdomen or lower back.  Regular uterine contractions that may feel like tightening of the abdomen.  A feeling of increased pressure in the pelvis.  Increased watery or bloody mucus discharge from the vagina.  Water breaking (ruptured amniotic sac).  Why is it important to recognize signs of preterm labor? It is important to recognize signs of preterm labor because babies who are born prematurely may not be fully developed. This can put them at an increased risk for:  Long-term (chronic) heart and lung problems.  Difficulty immediately after birth with regulating body systems, including blood sugar, body temperature, heart rate, and breathing rate.  Bleeding in the brain.  Cerebral palsy.  Learning difficulties.  Death.  These risks are highest for babies who are born before 34 weeks of pregnancy. How is preterm labor treated? Treatment depends on the length of your pregnancy, your condition, and the health of your baby. It may involve:  Having a stitch (suture) placed in your cervix to prevent your cervix from opening too early (cerclage).  Taking or being given medicines, such as: ? Hormone medicines. These may be given early in pregnancy to  help support the pregnancy. ? Medicine to stop contractions. ? Medicines to help mature the baby's lungs. These may be prescribed if the risk of delivery is high. ? Medicines to prevent your baby from developing cerebral palsy.  If the labor happens before 34 weeks of pregnancy, you may need to stay in the hospital. What should I do if I think I am in preterm labor? If you think that you are going into preterm labor, call your health care provider  right away. How can I prevent preterm labor in future pregnancies? To increase your chance of having a full-term pregnancy:  Do not use any tobacco products, such as cigarettes, chewing tobacco, and e-cigarettes. If you need help quitting, ask your health care provider.  Do not use street drugs or medicines that have not been prescribed to you during your pregnancy.  Talk with your health care provider before taking any herbal supplements, even if you have been taking them regularly.  Make sure you gain a healthy amount of weight during your pregnancy.  Watch for infection. If you think that you might have an infection, get it checked right away.  Make sure to tell your health care provider if you have gone into preterm labor before.  This information is not intended to replace advice given to you by your health care provider. Make sure you discuss any questions you have with your health care provider. Document Released: 01/01/2004 Document Revised: 03/23/2016 Document Reviewed: 03/03/2016 Elsevier Interactive Patient Education  2018 [employer].

## 2018-02-02 NOTE — Progress Notes (Signed)
LABOR PROGRESS NOTE  Subjective:  Patient seen and examined for progress of labor. Patient comfortable feeling contractions. Denies headache.  Objective:  Vitals:   02/02/18 1853 02/02/18 1900 02/02/18 1932 02/02/18 2000  BP: 137/78 127/78 138/86 135/67  Pulse: (!) 115 (!) 112 (!) 111 (!) 110  Resp: 16 16 18 18   Temp:   98.4 F (36.9 C)   TempSrc:   Oral   SpO2:      Weight:      Height:       Dilation: 2 Effacement (%): 50 Station: Ballotable Presentation: Vertex Exam by:: Ferne CoeS Earl RNC FHT: 135 bpm, moderate variability, accelerations present, occasional variable decelerations TOCO: regular, every 3-4 minutes  Assessment/Plan: Christie Morrow is a 37 y.o. G3P1011 at 1133w2d here for IOL for cHTN w/SIPE. PMH significant for PPH, depression/anxiety (on Cymbalta/wellbutrin), HSV-2 (no recent outbreaks), Rh negative.  Patient's BP well controlled at 143/76 and without headache.  Making regular contractions.  Will titrate Pitocin to achieve adequate labor.  Plan to give Cytotec post-delivery given PPH history.  Labor: stage 1 Preeclampsia: on IV mag Fetal wellbeing: category 2 Pain control: IV pain meds PRN, planning epidural I/D: uknown on PCN Anticipated MOD: continue expectant management, anticipate SVD  Durward Parcelavid McMullen, DO, PGY-2 02/02/2018, 8:43 PM

## 2018-02-02 NOTE — Anesthesia Pain Management Evaluation Note (Signed)
  CRNA Pain Management Visit Note  Patient: Christie Morrow, 37 y.o., female  "Hello I am a [facility]. We have an anesthesia team available at all times to provide care throughout the hospital, including epidural management and anesthesia for C-section. I don't know your plan for the delivery whether it a natural birth, water birth, IV sedation, nitrous supplementation, doula or epidural, but we want to meet your pain goals."   1.Was your pain managed to your expectations on prior hospitalizations?   Yes   2.What is your expectation for pain management during this hospitalization?     Epidural  3.How can we help you reach that goal? epidural  Record the patient's initial score and the patient's pain goal.   Pain: 0  Pain Goal: 5 [facility] wants you to be able to say your pain was always managed very well.  [name] 02/02/2018

## 2018-02-03 ENCOUNTER — Other Ambulatory Visit: Payer: Self-pay

## 2018-02-03 ENCOUNTER — Inpatient Hospital Stay (HOSPITAL_COMMUNITY): Payer: Medicaid Other | Admitting: Anesthesiology

## 2018-02-03 DIAGNOSIS — O134 Gestational [pregnancy-induced] hypertension without significant proteinuria, complicating childbirth: Secondary | ICD-10-CM

## 2018-02-03 DIAGNOSIS — Z3A36 36 weeks gestation of pregnancy: Secondary | ICD-10-CM

## 2018-02-03 LAB — COMPREHENSIVE METABOLIC PANEL
ALBUMIN: 3.3 g/dL — AB (ref 3.5–5.0)
ALK PHOS: 151 U/L — AB (ref 38–126)
ALT: 17 U/L (ref 14–54)
ANION GAP: 11 (ref 5–15)
AST: 29 U/L (ref 15–41)
BILIRUBIN TOTAL: 0.8 mg/dL (ref 0.3–1.2)
BUN: 6 mg/dL (ref 6–20)
CALCIUM: 7.9 mg/dL — AB (ref 8.9–10.3)
CO2: 21 mmol/L — ABNORMAL LOW (ref 22–32)
Chloride: 99 mmol/L — ABNORMAL LOW (ref 101–111)
Creatinine, Ser: 0.59 mg/dL (ref 0.44–1.00)
GFR calc Af Amer: >60 mL/min (ref >60)
GLUCOSE: 119 mg/dL — AB (ref 65–99)
Potassium: 4.8 mmol/L (ref 3.5–5.1)
Sodium: 131 mmol/L — ABNORMAL LOW (ref 135–145)
TOTAL PROTEIN: 7.4 g/dL (ref 6.5–8.1)

## 2018-02-03 LAB — CBC
HEMATOCRIT: 35 % — AB (ref 36.0–46.0)
HEMOGLOBIN: 12.4 g/dL (ref 12.0–15.0)
MCH: 33.9 pg (ref 26.0–34.0)
MCHC: 35.4 g/dL (ref 30.0–36.0)
MCV: 95.6 fL (ref 78.0–100.0)
Platelets: 286 10*3/uL (ref 150–400)
RBC: 3.66 MIL/uL — ABNORMAL LOW (ref 3.87–5.11)
RDW: 13.5 % (ref 11.5–15.5)
WBC: 16.6 10*3/uL — ABNORMAL HIGH (ref 4.0–10.5)

## 2018-02-03 LAB — RPR: RPR: NONREACTIVE

## 2018-02-03 MED ORDER — DIPHENHYDRAMINE HCL 25 MG PO CAPS: 25.0000 mg | ORAL_CAPSULE | Freq: Four times a day (QID) | ORAL | Status: DC | PRN

## 2018-02-03 MED ORDER — ZOLPIDEM TARTRATE 5 MG PO TABS: 5.0000 mg | ORAL_TABLET | Freq: Every evening | ORAL | Status: DC | PRN

## 2018-02-03 MED ORDER — SIMETHICONE 80 MG PO CHEW
80.0000 mg | CHEWABLE_TABLET | ORAL | Status: DC | PRN
  Administered 2018-02-04: 80 mg via ORAL
  Filled 2018-02-03: qty 1

## 2018-02-03 MED ORDER — LIDOCAINE HCL (PF) 1 % IJ SOLN
INTRAMUSCULAR | Status: DC | PRN
  Administered 2018-02-03: 13 mL via EPIDURAL

## 2018-02-03 MED ORDER — PRENATAL MULTIVITAMIN CH
1.0000 | ORAL_TABLET | Freq: Every day | ORAL | Status: DC
  Administered 2018-02-04 – 2018-02-05 (×2): 1 via ORAL
  Filled 2018-02-03 (×2): qty 1

## 2018-02-03 MED ORDER — OXYTOCIN BOLUS FROM INFUSION
500.0000 mL | Freq: Once | INTRAVENOUS | Status: AC
  Administered 2018-02-03: 500 mL via INTRAVENOUS

## 2018-02-03 MED ORDER — TETANUS-DIPHTH-ACELL PERTUSSIS 5-2.5-18.5 LF-MCG/0.5 IM SUSP
0.5000 mL | Freq: Once | INTRAMUSCULAR | Status: DC
  Filled 2018-02-03: qty 0.5

## 2018-02-03 MED ORDER — SOD CITRATE-CITRIC ACID 500-334 MG/5ML PO SOLN
ORAL | Status: AC
  Administered 2018-02-03: 30 mL via ORAL
  Filled 2018-02-03: qty 15

## 2018-02-03 MED ORDER — LACTATED RINGERS IV SOLN
500.0000 mL | Freq: Once | INTRAVENOUS | Status: AC
  Administered 2018-02-03: 300 mL via INTRAVENOUS

## 2018-02-03 MED ORDER — DIPHENHYDRAMINE HCL 50 MG/ML IJ SOLN
12.5000 mg | INTRAMUSCULAR | Status: DC | PRN
  Administered 2018-02-03: 12.5 mg via INTRAVENOUS
  Filled 2018-02-03: qty 1

## 2018-02-03 MED ORDER — IBUPROFEN 600 MG PO TABS
600.0000 mg | ORAL_TABLET | Freq: Four times a day (QID) | ORAL | Status: DC
  Administered 2018-02-03 – 2018-02-05 (×8): 600 mg via ORAL
  Filled 2018-02-03 (×8): qty 1

## 2018-02-03 MED ORDER — ONDANSETRON HCL 4 MG PO TABS: 4.0000 mg | ORAL_TABLET | ORAL | Status: DC | PRN

## 2018-02-03 MED ORDER — ONDANSETRON HCL 4 MG/2ML IJ SOLN: 4.0000 mg | INTRAMUSCULAR | Status: DC | PRN

## 2018-02-03 MED ORDER — DIBUCAINE 1 % RE OINT
1.0000 "application " | TOPICAL_OINTMENT | RECTAL | Status: DC | PRN
  Filled 2018-02-03: qty 28

## 2018-02-03 MED ORDER — EPHEDRINE 5 MG/ML INJ
10.0000 mg | INTRAVENOUS | Status: DC | PRN
  Administered 2018-02-03: 10 mg via INTRAVENOUS

## 2018-02-03 MED ORDER — MAGNESIUM SULFATE 40 G IN LACTATED RINGERS - SIMPLE
2.0000 g/h | INTRAVENOUS | Status: AC
  Administered 2018-02-04: 2 g/h via INTRAVENOUS
  Filled 2018-02-03: qty 40

## 2018-02-03 MED ORDER — TRANEXAMIC ACID 1000 MG/10ML IV SOLN
1000.0000 mg | INTRAVENOUS | Status: AC
  Administered 2018-02-03: 1000 mg via INTRAVENOUS
  Filled 2018-02-03: qty 1100

## 2018-02-03 MED ORDER — PHENYLEPHRINE 40 MCG/ML (10ML) SYRINGE FOR IV PUSH (FOR BLOOD PRESSURE SUPPORT): 80.0000 ug | PREFILLED_SYRINGE | INTRAVENOUS | Status: DC | PRN

## 2018-02-03 MED ORDER — FENTANYL 2.5 MCG/ML BUPIVACAINE 1/10 % EPIDURAL INFUSION (WH - ANES)
14.0000 mL/h | INTRAMUSCULAR | Status: DC | PRN
  Administered 2018-02-03: 14 mL/h via EPIDURAL

## 2018-02-03 MED ORDER — BENZOCAINE-MENTHOL 20-0.5 % EX AERO
1.0000 "application " | INHALATION_SPRAY | CUTANEOUS | Status: DC | PRN
  Filled 2018-02-03: qty 56

## 2018-02-03 MED ORDER — LIDOCAINE HCL (PF) 1 % IJ SOLN
INTRAMUSCULAR | Status: AC
  Filled 2018-02-03: qty 30

## 2018-02-03 MED ORDER — EPHEDRINE 5 MG/ML INJ
10.0000 mg | INTRAVENOUS | Status: DC | PRN
  Administered 2018-02-03: 10 mg via INTRAVENOUS
  Filled 2018-02-03: qty 4

## 2018-02-03 MED ORDER — MISOPROSTOL 200 MCG PO TABS: 800.0000 ug | ORAL_TABLET | Freq: Once | ORAL | Status: DC

## 2018-02-03 MED ORDER — LACTATED RINGERS IV SOLN
INTRAVENOUS | Status: DC
  Administered 2018-02-03 – 2018-02-04 (×2): via INTRAVENOUS

## 2018-02-03 MED ORDER — PHENYLEPHRINE 40 MCG/ML (10ML) SYRINGE FOR IV PUSH (FOR BLOOD PRESSURE SUPPORT)
PREFILLED_SYRINGE | INTRAVENOUS | Status: AC
  Filled 2018-02-03: qty 20

## 2018-02-03 MED ORDER — FENTANYL 2.5 MCG/ML BUPIVACAINE 1/10 % EPIDURAL INFUSION (WH - ANES)
INTRAMUSCULAR | Status: AC
  Filled 2018-02-03: qty 100

## 2018-02-03 MED ORDER — SOD CITRATE-CITRIC ACID 500-334 MG/5ML PO SOLN
30.0000 mL | Freq: Once | ORAL | Status: AC
  Administered 2018-02-03: 30 mL via ORAL

## 2018-02-03 MED ORDER — ONDANSETRON HCL 4 MG/2ML IJ SOLN
4.0000 mg | Freq: Three times a day (TID) | INTRAMUSCULAR | Status: DC | PRN
  Administered 2018-02-03: 4 mg via INTRAVENOUS
  Filled 2018-02-03: qty 2

## 2018-02-03 MED ORDER — ACETAMINOPHEN 325 MG PO TABS
650.0000 mg | ORAL_TABLET | ORAL | Status: DC | PRN
Start: 2018-02-03 — End: 2018-02-05
  Administered 2018-02-03 – 2018-02-05 (×3): 650 mg via ORAL
  Filled 2018-02-03 (×3): qty 2

## 2018-02-03 MED ORDER — MISOPROSTOL 200 MCG PO TABS
ORAL_TABLET | ORAL | Status: AC
  Administered 2018-02-03: 800 ug via BUCCAL
  Filled 2018-02-03: qty 5

## 2018-02-03 MED ORDER — FAMOTIDINE IN NACL 20-0.9 MG/50ML-% IV SOLN
20.0000 mg | Freq: Once | INTRAVENOUS | Status: AC
  Administered 2018-02-03: 20 mg via INTRAVENOUS
  Filled 2018-02-03: qty 50

## 2018-02-03 MED ORDER — COCONUT OIL OIL
1.0000 "application " | TOPICAL_OIL | Status: DC | PRN
  Filled 2018-02-03: qty 120

## 2018-02-03 MED ORDER — SENNOSIDES-DOCUSATE SODIUM 8.6-50 MG PO TABS
2.0000 | ORAL_TABLET | Freq: Every evening | ORAL | Status: DC | PRN
  Administered 2018-02-04: 2 via ORAL
  Filled 2018-02-03: qty 2

## 2018-02-03 MED ORDER — WITCH HAZEL-GLYCERIN EX PADS: 1.0000 "application " | MEDICATED_PAD | CUTANEOUS | Status: DC | PRN

## 2018-02-03 MED ORDER — SENNOSIDES-DOCUSATE SODIUM 8.6-50 MG PO TABS: 2.0000 | ORAL_TABLET | ORAL | Status: DC

## 2018-02-03 MED ORDER — PHENYLEPHRINE 40 MCG/ML (10ML) SYRINGE FOR IV PUSH (FOR BLOOD PRESSURE SUPPORT)
80.0000 ug | PREFILLED_SYRINGE | INTRAVENOUS | Status: AC | PRN
  Administered 2018-02-03 (×3): 80 ug via INTRAVENOUS

## 2018-02-03 NOTE — Progress Notes (Signed)
Subjective: Doing well. Pain better controlled with the epidural. Headache/floaters improved. Did have trouble keeping pressures up after epidural. Received epedrine injection x2, phenylephrine x3.   Objective: BP (!) 139/59   Pulse (!) 107   Temp 98.5 F (36.9 C)   Resp 16   Ht 5\' 7"  (1.702 m)   Wt 136.1 kg (300 lb)   LMP 05/24/2017 (Exact Date)   SpO2 97%   BMI 46.99 kg/m  I/O last 3 completed shifts: In: 3267.2 [P.O.:1220; I.V.:1597.2; IV Piggyback:450] Out: 1200 [Urine:1200] Total I/O In: 851.8 [I.V.:801.8; IV Piggyback:50] Out: 100 [Emesis/NG output:100]  FHT:  FHR: 130 bpm, variability: moderate,  accelerations:  Abscent,  decelerations:  Absent UC:   regular, every 2-3 minutes SVE:   Dilation: 4.5 Effacement (%): 60 Station: -3 Exam by:: Jabil CircuitS Moyer RN  Labs: Lab Results  Component Value Date   WBC 16.6 (H) 02/03/2018   HGB 12.4 02/03/2018   HCT 35.0 (L) 02/03/2018   MCV 95.6 02/03/2018   PLT 286 02/03/2018    Assessment / Plan: Induction of labor due to preeclampsia,  progressing well on pitocin  Labor: Progressing on Pitocin, will continue to increase then AROM Preeclampsia:  on magnesium sulfate and no signs or symptoms of toxicity Fetal Wellbeing:  Category I Pain Control:  Epidural I/D:  n/a Anticipated MOD:  NSVD  Myrene BuddyJacob Samanta Gal 02/03/2018, 9:41 AM

## 2018-02-03 NOTE — Progress Notes (Signed)
LABOR PROGRESS NOTE  Subjective:  Patient seen and examined for progress of labor. Patient remains comfortable with more frequent and intense contractions.   Objective:  Vitals:   02/03/18 0112 02/03/18 0132 02/03/18 0202 02/03/18 0230  BP: 109/68 139/84 137/71 130/77  Pulse: (!) 103 (!) 109 (!) 109 (!) 115  Resp: 16 18 16 16   Temp:      TempSrc:      SpO2:      Weight:      Height:       Dilation: 3 Effacement (%): 60 Cervical Position: Posterior Station: -3 Presentation: Vertex Exam by:: Virl CageyBrooke Boyer, RN FHT: 135 bpm, mild variability, accelerations present, absent decelerations TOCO: irregular, every 3-5 minutes  Assessment/Plan: Jhoselyn G Craigis a 37 y.o.G3P1011 at 2840w2d here for IOL for cHTN w/SIPE. PMH significant for PPH, depression/anxiety (on Cymbalta/wellbutrin), HSV-2 (no recent outbreaks), Rh negative.  Will continue titrating Pitocin to achieve adequate labor.   Labor: stage 1 Preeclampsia: on IV mag without signs of toxicity, BP well controlled Fetal wellbeing: category 2 Pain control: IV pain meds PRN, planning epidural I/D: unkown on PCN Anticipated MOD: continue expectant management, anticipate SVD H/o PPH:  plan to give Cytotec and Pitocin post-delivery  Durward Parcelavid McMullen, DO, PGY-2 02/03/2018, 2:48 AM

## 2018-02-03 NOTE — Anesthesia Procedure Notes (Signed)
Epidural Patient location during procedure: OB Start time: 02/03/2018 7:54 AM End time: 02/03/2018 8:13 AM  Staffing Anesthesiologist: Lowella CurbMiller, Judit Awad Ray, MD Performed: anesthesiologist   Preanesthetic Checklist Completed: patient identified, site marked, surgical consent, pre-op evaluation, timeout performed, IV checked, risks and benefits discussed and monitors and equipment checked  Epidural Patient position: sitting Prep: ChloraPrep Patient monitoring: heart rate, cardiac monitor, continuous pulse ox and blood pressure Approach: midline Location: L2-L3 Injection technique: LOR saline  Needle:  Needle type: Tuohy  Needle gauge: 17 G Needle length: 9 cm Needle insertion depth: 7 cm Catheter type: closed end flexible Catheter size: 20 Guage Catheter at skin depth: 11 cm Test dose: negative  Assessment Events: blood not aspirated, injection not painful, no injection resistance, negative IV test and no paresthesia  Additional Notes Reason for block:procedure for pain

## 2018-02-03 NOTE — Anesthesia Preprocedure Evaluation (Signed)
Anesthesia Evaluation  Patient identified by MRN, date of birth, ID band Patient awake    Reviewed: Allergy & Precautions, NPO status , Patient's Chart, lab work & pertinent test results  Airway Mallampati: II  TM Distance: >3 FB Neck ROM: Full    Dental no notable dental hx.    Pulmonary neg pulmonary ROS,    Pulmonary exam normal breath sounds clear to auscultation       Cardiovascular hypertension, negative cardio ROS Normal cardiovascular exam Rhythm:Regular Rate:Normal     Neuro/Psych Anxiety Depression negative neurological ROS  negative psych ROS   GI/Hepatic negative GI ROS, Neg liver ROS, GERD  ,  Endo/Other  negative endocrine ROSMorbid obesity  Renal/GU negative Renal ROS  negative genitourinary   Musculoskeletal negative musculoskeletal ROS (+)   Abdominal (+) + obese,   Peds negative pediatric ROS (+)  Hematology negative hematology ROS (+)   Anesthesia Other Findings   Reproductive/Obstetrics negative OB ROS (+) Pregnancy                             Anesthesia Physical Anesthesia Plan  ASA: III  Anesthesia Plan: Epidural   Post-op Pain Management:    Induction:   PONV Risk Score and Plan: Treatment may vary due to age or medical condition  Airway Management Planned:   Additional Equipment:   Intra-op Plan:   Post-operative Plan:   Informed Consent:   Plan Discussed with:   Anesthesia Plan Comments:         Anesthesia Quick Evaluation

## 2018-02-03 NOTE — Progress Notes (Signed)
LABOR PROGRESS NOTE  Christie Morrow is a 37 y.o. G3P1011 at 7676w2d  admitted for IOL for cHTN w/ SIPE  Subjective: Patient doing well. Starting to feel some pressure with contractions.  Objective: BP 123/61   Pulse (!) 112   Temp 97.8 F (36.6 C) (Oral)   Resp 16   Ht 5\' 7"  (1.702 m)   Wt 300 lb (136.1 kg)   LMP 05/24/2017 (Exact Date)   SpO2 98%   BMI 46.99 kg/m  or  Vitals:   02/03/18 0433 02/03/18 0501 02/03/18 0533 02/03/18 0600  BP: 116/64 120/62 116/83 123/61  Pulse: (!) 108 (!) 109 (!) 123 (!) 112  Resp: 16 18 18 16   Temp:      TempSrc:      SpO2:      Weight:      Height:        Last SVE Dilation: 3.5 Effacement (%): 40 Cervical Position: Posterior Station: -3 Presentation: Vertex Exam by:: Dr Nira Retortegele FHT: baseline rate 135, moderate varibility, +acel, no decel Toco: ctx q 2-5 min  Total I/O In: 2833.8 [P.O.:1220; I.V.:1413.8; IV Piggyback:200] Out: 1200 [Urine:1200]   Assessment / Plan: 37 y.o. G3P1011 at 1376w2d [facility]  Preeclampsia: On IV Mag. No signs/sx of toxicity. Adequate UOP Labor: On IV Pitocin, currently at 2228mU/min. AROM, clear fluid. Fetal Wellbeing:  Cat I Pain Control:   Per patient's request Anticipated MOD:  SVD Hx PPH: Plan to give Pitocin and misoprostol pospartum as prophylaxis  Frederik PearJulie P Degele, MD 02/03/2018, 6:32 AM

## 2018-02-03 NOTE — Anesthesia Postprocedure Evaluation (Signed)
Anesthesia Post Note  Patient: Christie Morrow  Procedure(s) Performed: AN AD HOC LABOR EPIDURAL     Patient location during evaluation: Mother Baby Anesthesia Type: Epidural Level of consciousness: awake and alert and oriented Pain management: satisfactory to patient Vital Signs Assessment: post-procedure vital signs reviewed and stable Respiratory status: respiratory function stable Cardiovascular status: stable Postop Assessment: no headache, no backache, epidural receding, patient able to bend at knees, no signs of nausea or vomiting and adequate PO intake Anesthetic complications: no    Last Vitals:  Vitals:   02/03/18 1700 02/03/18 1819  BP: 116/70   Pulse: (!) 120   Resp: 18 18  Temp: 36.7 C   SpO2: 98%     Last Pain:  Vitals:   02/03/18 1700  TempSrc: Oral  PainSc: 0-No pain   Pain Goal: Patients Stated Pain Goal: 5 (02/03/18 0720)               Karleen DolphinFUSSELL,Aleksandar Duve

## 2018-02-04 LAB — CBC
HCT: 26.6 % — ABNORMAL LOW (ref 36.0–46.0)
HEMOGLOBIN: 9.2 g/dL — AB (ref 12.0–15.0)
MCH: 33.2 pg (ref 26.0–34.0)
MCHC: 34.6 g/dL (ref 30.0–36.0)
MCV: 96 fL (ref 78.0–100.0)
Platelets: 275 10*3/uL (ref 150–400)
RBC: 2.77 MIL/uL — AB (ref 3.87–5.11)
RDW: 13.8 % (ref 11.5–15.5)
WBC: 13.9 10*3/uL — ABNORMAL HIGH (ref 4.0–10.5)

## 2018-02-04 LAB — COMPREHENSIVE METABOLIC PANEL
ALK PHOS: 114 U/L (ref 38–126)
ALT: 17 U/L (ref 14–54)
AST: 22 U/L (ref 15–41)
Albumin: 2.7 g/dL — ABNORMAL LOW (ref 3.5–5.0)
Anion gap: 11 (ref 5–15)
BUN: 6 mg/dL (ref 6–20)
CO2: 21 mmol/L — AB (ref 22–32)
CREATININE: 0.57 mg/dL (ref 0.44–1.00)
Calcium: 6.9 mg/dL — ABNORMAL LOW (ref 8.9–10.3)
Chloride: 101 mmol/L (ref 101–111)
GFR calc non Af Amer: >60 mL/min (ref >60)
Glucose, Bld: 119 mg/dL — ABNORMAL HIGH (ref 65–99)
Potassium: 3.7 mmol/L (ref 3.5–5.1)
SODIUM: 133 mmol/L — AB (ref 135–145)
Total Bilirubin: 0.3 mg/dL (ref 0.3–1.2)
Total Protein: 5.9 g/dL — ABNORMAL LOW (ref 6.5–8.1)

## 2018-02-04 LAB — STREP GP B NAA: STREP GROUP B AG: NEGATIVE

## 2018-02-04 LAB — GC/CHLAMYDIA PROBE AMP
Chlamydia trachomatis, NAA: NEGATIVE
NEISSERIA GONORRHOEAE BY PCR: NEGATIVE

## 2018-02-04 MED ORDER — FERROUS GLUCONATE 324 (38 FE) MG PO TABS
324.0000 mg | ORAL_TABLET | Freq: Every day | ORAL | Status: DC
  Filled 2018-02-04 (×2): qty 1

## 2018-02-04 MED ORDER — CYCLOBENZAPRINE HCL 10 MG PO TABS
10.0000 mg | ORAL_TABLET | Freq: Three times a day (TID) | ORAL | Status: DC | PRN
  Administered 2018-02-04 – 2018-02-05 (×2): 10 mg via ORAL
  Filled 2018-02-04 (×3): qty 1

## 2018-02-04 NOTE — Progress Notes (Signed)
Daily Postpartum Note  Admission Date: 02/02/2018 Current Date: 02/04/2018 6:38 AM  Christie Morrow is a 37 y.o. [id number] PPD#1 SVD/1st (repaired) @ 36wks, admitted for pre-eclampsia with severe features (neuro s/s) superimposed on cHTN.  Pregnancy complicated by: Patient Active Problem List   Diagnosis Date Noted  . NSVD (normal spontaneous vaginal delivery) 02/03/2018  . Preeclampsia 01/06/2018  . Cervical high risk human papillomavirus (HPV) DNA test positive 08/18/2017  . Rh negative state in antepartum period 08/10/2017  . Marijuana use 08/10/2017  . Supervision of high risk pregnancy, antepartum 08/08/2017  . Chronic hypertension during pregnancy, antepartum 08/08/2017  . 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 Asymptomatic bacteriuria during pregnancy in first trimester 08/08/2017  . Depression with anxiety 12/31/2016  . Chronic pain of both knees 12/31/2016  . Gastroesophageal reflux disease without esophagitis 12/31/2016  . Fibromyalgia 10/06/2015  . Essential hypertension 10/06/2015  . Depression 10/06/2015  . Insomnia 10/06/2015  . Morbid obesity (HCC) 10/06/2015  . Venous stasis 10/06/2015    Overnight/24hr events:  Foley out about two hours ago with UOP in bag  Subjective:  No s/s of pre-eclampsia. Pt up walking to bathroom to try and void for 1st time. Pain controlled and taking PO w/o issue. Lochia normal  Objective:    Current Vital Signs 24h Vital Sign Ranges  T 97.6 F (36.4 C) Temp  Avg: 98.2 F (36.8 C)  Min: 97.2 F (36.2 C)  Max: 100.3 F (37.9 C)  BP 137/73 BP  Min: 86/42  Max: 161/94  HR 96 Pulse  Avg: 108.8  Min: 64  Max: 275  RR 18 Resp  Avg: 17.7  Min: 16  Max: 20  SaO2 98 % (Room Air) SpO2  Avg: 96.7 %  Min: 95 %  Max: 100 %       24 Hour I/O Current Shift I/O  Time Ins Outs 04/12 0701 - 04/13 0700 In: 6650.5 [P.O.:3075; I.V.:3365.5] Out: 4558 [Urine:3685] 04/12 1901 - 04/13 0700 In: 3790 [P.O.:2415; I.V.:1375] Out: 2950 [Urine:2950]    Patient Vitals for the past 24 hrs:  BP Temp Temp src Pulse Resp SpO2  02/04/18 0559 137/73 97.6 F (36.4 C) Oral 96 18 98 %  02/04/18 0500 - - - - 16 -  02/04/18 0400 (!) 114/59 97.9 F (36.6 C) Oral 99 18 97 %  02/04/18 0300 - - - - 16 -  02/04/18 0200 - - - - 16 -  02/04/18 0100 - - - - 17 -  02/04/18 0002 135/64 98.2 F (36.8 C) Oral (!) 105 18 97 %  02/03/18 1941 111/65 98.3 F (36.8 C) Oral (!) 123 18 97 %  02/03/18 1819 - - - - 18 -  02/03/18 1700 116/70 98.1 F (36.7 C) Oral (!) 120 18 98 %  02/03/18 1600 133/79 100.3 F (37.9 C) Oral (!) 104 18 100 %  02/03/18 1531 120/71 - - (!) 111 18 -  02/03/18 1516 121/83 - - (!) 108 - -  02/03/18 1501 (!) 105/57 - - (!) 101 - -  02/03/18 1446 (!) 107/47 - - (!) 107 - -  02/03/18 1445 (!) 113/42 - - (!) 101 - -  02/03/18 1432 (!) 161/94 - - (!) 275 - -  02/03/18 1417 (!) 123/95 - - 99 18 -  02/03/18 1415 (!) 115/99 - - (!) 117 18 -  02/03/18 1402 (!) 124/57 - - (!) 116 18 -  02/03/18 1346 120/66 - - (!) 127 18 -  02/03/18 1334 (!) 122/59 - - (!) 130 16 -  02/03/18 1332 131/64 - - (!) 133 16 -  02/03/18 1301 104/67 (!) 97.4 F (36.3 C) Oral (!) 115 16 -  02/03/18 1231 (!) 87/47 - - 92 18 -  02/03/18 1202 (!) 95/40 - - 97 16 -  02/03/18 1158 - (!) 97.2 F (36.2 C) Oral - - -  02/03/18 1131 131/62 - - (!) 112 16 -  02/03/18 1101 128/72 - - (!) 117 18 -  02/03/18 1030 121/67 - - (!) 117 18 -  02/03/18 1002 128/60 98.3 F (36.8 C) Oral (!) 109 18 -  02/03/18 0933 - - - - 16 -  02/03/18 0930 (!) 139/59 - - (!) 107 - -  02/03/18 0912 (!) 144/65 - - (!) 108 16 -  02/03/18 0911 - - - - - 97 %  02/03/18 0907 140/65 - - (!) 108 16 -  02/03/18 0906 - - - - - 97 %  02/03/18 0902 131/61 - - (!) 112 16 -  02/03/18 0901 - - - - - 96 %  02/03/18 0857 132/67 - - (!) 106 16 -  02/03/18 0856 - - - - - 96 %  02/03/18 0851 (!) 127/58 - - (!) 111 18 96 %  02/03/18 0849 133/66 98.5 F (36.9 C) - (!) 106 20 97 %  02/03/18 0847 114/70 -  - (!) 110 20 97 %  02/03/18 0846 (!) 89/39 - - (!) 126 20 97 %  02/03/18 0841 (!) 96/34 - - 64 20 95 %  02/03/18 0835 (!) 121/55 - - 99 18 95 %  02/03/18 0834 (!) 121/53 - - 96 18 96 %  02/03/18 0830 (!) 86/42 - - 67 20 95 %  02/03/18 0826 103/76 - - 93 18 96 %  02/03/18 0823 (!) 120/59 - - (!) 101 18 97 %  02/03/18 0820 (!) 106/50 - - 90 18 97 %  02/03/18 0817 (!) 87/40 - - 80 20 -  02/03/18 0816 - - - - - 96 %  02/03/18 0812 (!) 119/46 - - (!) 104 20 -  02/03/18 0811 - - - - - 97 %  02/03/18 0807 111/67 - - 88 - -  02/03/18 0804 115/67 - - 96 - 96 %  02/03/18 0802 110/68 - - (!) 108 - -  02/03/18 0730 124/73 - - (!) 107 - -  02/03/18 0720 - - - - 18 -  02/03/18 0713 119/69 - - (!) 109 - -  02/03/18 0703 - - - - 16 -  02/03/18 0655 119/71 - - (!) 102 16 -  02/03/18 0654 - 98.5 F (36.9 C) Oral - 20 -    Physical exam: General: Well nourished, well developed female in no acute distress. Abdomen: nttp Respiratory: no resp distress Skin: Warm and dry.   Medications: Current Facility-Administered Medications  Medication Dose Route Frequency Provider Last Rate Last Dose  . acetaminophen (TYLENOL) tablet 650 mg  650 mg Oral Q4H PRN Myrene Buddy, MD   650 mg at 02/03/18 2225  . benzocaine-Menthol (DERMOPLAST) 20-0.5 % topical spray 1 application  1 application Topical PRN Myrene Buddy, MD      . buPROPion (WELLBUTRIN XL) 24 hr tablet 300 mg  300 mg Oral Daily Myrene Buddy, MD   300 mg at 02/03/18 2046  . coconut oil  1 application Topical PRN Myrene Buddy, MD      . witch hazel-glycerin (TUCKS)  pad 1 application  1 application Topical PRN Myrene BuddyFletcher, Jacob, MD       And  . dibucaine (NUPERCAINAL) 1 % rectal ointment 1 application  1 application Rectal PRN Myrene BuddyFletcher, Jacob, MD      . diphenhydrAMINE (BENADRYL) capsule 25 mg  25 mg Oral Q6H PRN Myrene BuddyFletcher, Jacob, MD      . DULoxetine (CYMBALTA) DR capsule 60 mg  60 mg Oral Daily Myrene BuddyFletcher, Jacob, MD   60 mg at 02/03/18 2046   . ibuprofen (ADVIL,MOTRIN) tablet 600 mg  600 mg Oral Q6H Myrene BuddyFletcher, Jacob, MD   600 mg at 02/04/18 [id number]  . lactated ringers infusion   Intravenous Continuous Myrene BuddyFletcher, Jacob, MD 100 mL/hr at 02/03/18 2227    . magnesium sulfate 40 grams in LR 500 mL OB infusion  2 g/hr Intravenous Continuous Arabella MerlesShaw, Kimberly D, CNM 25 mL/hr at 02/03/18 1600 2 g/hr at 02/03/18 1600  . misoprostol (CYTOTEC) tablet 800 mcg  800 mcg Buccal Once Myrene BuddyFletcher, Jacob, MD      . ondansetron [facility](ZOFRAN) tablet 4 mg  4 mg Oral Q4H PRN Myrene BuddyFletcher, Jacob, MD       Or  . ondansetron [facility](ZOFRAN) injection 4 mg  4 mg Intravenous Q4H PRN Myrene BuddyFletcher, Jacob, MD      . prenatal multivitamin tablet 1 tablet  1 tablet Oral Q1200 Myrene BuddyFletcher, Jacob, MD      . senna-docusate (Senokot-S) tablet 2 tablet  2 tablet Oral QHS PRN Kite BingPickens, Holger Sokolowski, MD      . simethicone (MYLICON) chewable tablet 80 mg  80 mg Oral PRN Myrene BuddyFletcher, Jacob, MD   80 mg at 02/04/18 0356  . Tdap (BOOSTRIX) injection 0.5 mL  0.5 mL Intramuscular Once Myrene BuddyFletcher, Jacob, MD      . zolpidem [facility](AMBIEN) tablet 5 mg  5 mg Oral QHS PRN Myrene BuddyFletcher, Jacob, MD        Labs:  Recent Labs  Lab 02/03/18 0432 02/04/18 0529  WBC 16.6* 13.9*  HGB 12.4 9.2*  HCT 35.0* 26.6*  PLT 286 275    Recent Labs  Lab 02/03/18 0432  NA 131*  K 4.8  CL 99*  CO2 21*  BUN 6  CREATININE 0.59  CALCIUM 7.9*  PROT 7.4  BILITOT 0.8  ALKPHOS 151*  ALT 17  AST 29  GLUCOSE 119*    Radiology: no new imaging  Assessment & Plan:  Pt doing well *Postpartum: routine care. [facility] *CV: doing well on no meds. Continue mg x 24h (1330 today). Surveillance labs negative this morning *Anemia: asymptomatic. Will add on qday iron *Psych: no issues. Continue home wellbutrin and cymbalta. SW consulted for h/o anx and depression. Recommend 7d pp visit with FT for mood and bp check on discharge  *Rh neg: newborn is also Rh neg *PPx: OOB ad lib, SCDs.  *FEN/GI: regular diet, MIVF while on Mg *Dispo:  likely tomorrow.   Cornelia Copaharlie Nicki Furlan, Jr. MD Attending Center for [facility](Faculty Practice)

## 2018-02-04 NOTE — Progress Notes (Signed)
Mother of baby was referred for history of depression and anxiety. Referral screened out by CSW because per chart review, patient is actively taking her Wellbutrin and Cymbalta. Per notes from MD, patient has no recent episodes.   Please contact CSW if mother of baby requests, if needs arise, or if mother of baby scores greater than a nine or answers yes to question ten on [place] Postpartum Depression Screen.   Rasaan Brotherton, MSW, LCSW-A Clinical Social Worker [facility] [phone] 

## 2018-02-05 ENCOUNTER — Encounter (HOSPITAL_COMMUNITY): Payer: Self-pay | Admitting: Obstetrics & Gynecology

## 2018-02-05 MED ORDER — IBUPROFEN 600 MG PO TABS: 600.0000 mg | ORAL_TABLET | Freq: Four times a day (QID) | ORAL | 0 refills | Status: DC

## 2018-02-05 NOTE — Progress Notes (Signed)
Discharge instructions reviewed with patient.  Patient states understanding of home care, medications, activity, signs/symptoms to report to MD and return MD office visit.  Patients significant other and family will assist with her care @ home.  No home  equipment needed, patient has prescriptions and all personal belongings.  Patient will remain with baby for rooming- in.

## 2018-02-05 NOTE — Discharge Instructions (Signed)
Postpartum Care After Vaginal Delivery °The period of time right after you deliver your newborn is called the postpartum period. °What kind of medical care will I receive? °· You may continue to receive fluids and medicines through an IV tube inserted into one of your veins. °· If an incision was made near your vagina (episiotomy) or if you had some vaginal tearing during delivery, cold compresses may be placed on your episiotomy or your tear. This helps to reduce pain and swelling. °· You may be given a squirt bottle to use when you go to the bathroom. You may use this until you are comfortable wiping as usual. To use the squirt bottle, follow these steps: °? Before you urinate, fill the squirt bottle with warm water. Do not use hot water. °? After you urinate, while you are sitting on the toilet, use the squirt bottle to rinse the area around your urethra and vaginal opening. This rinses away any urine and blood. °? You may do this instead of wiping. As you start healing, you may use the squirt bottle before wiping yourself. Make sure to wipe gently. °? Fill the squirt bottle with clean water every time you use the bathroom. °· You will be given sanitary pads to wear. °How can I expect to feel? °· You may not feel the need to urinate for several hours after delivery. °· You will have some soreness and pain in your abdomen and vagina. °· If you are breastfeeding, you may have uterine contractions every time you breastfeed for up to several weeks postpartum. Uterine contractions help your uterus return to its normal size. °· It is normal to have vaginal bleeding (lochia) after delivery. The amount and appearance of lochia is often similar to a menstrual period in the first week after delivery. It will gradually decrease over the next few weeks to a dry, yellow-brown discharge. For most women, lochia stops completely by 6-8 weeks after delivery. Vaginal bleeding can vary from woman to woman. °· Within the first few  days after delivery, you may have breast engorgement. This is when your breasts feel heavy, full, and uncomfortable. Your breasts may also throb and feel hard, tightly stretched, warm, and tender. After this occurs, you may have milk leaking from your breasts. Your health care provider can help you relieve discomfort due to breast engorgement. Breast engorgement should go away within a few days. °· You may feel more sad or worried than normal due to hormonal changes after delivery. These feelings should not last more than a few days. If these feelings do not go away after several days, speak with your health care provider. °How should I care for myself? °· Tell your health care provider if you have pain or discomfort. °· Drink enough water to keep your urine clear or pale yellow. °· Wash your hands thoroughly with soap and water for at least 20 seconds after changing your sanitary pads, after using the toilet, and before holding or feeding your baby. °· If you are not breastfeeding, avoid touching your breasts a lot. Doing this can make your breasts produce more milk. °· If you become weak or lightheaded, or you feel like you might faint, ask for help before: °? Getting out of bed. °? Showering. °· Change your sanitary pads frequently. Watch for any changes in your flow, such as a sudden increase in volume, a change in color, the passing of large blood clots. If you pass a blood clot from your vagina, save it   to show to your health care provider. Do not flush blood clots down the toilet without having your health care provider look at them. °· Make sure that all your vaccinations are up to date. This can help protect you and your baby from getting certain diseases. You may need to have immunizations done before you leave the hospital. °· If desired, talk with your health care provider about methods of family planning or birth control (contraception). °How can I start bonding with my baby? °Spending as much time as  possible with your baby is very important. During this time, you and your baby can get to know each other and develop a bond. Having your baby stay with you in your room (rooming in) can give you time to get to know your baby. Rooming in can also help you become comfortable caring for your baby. Breastfeeding can also help you bond with your baby. °How can I plan for returning home with my baby? °· Make sure that you have a car seat installed in your vehicle. °? Your car seat should be checked by a certified car seat installer to make sure that it is installed safely. °? Make sure that your baby fits into the car seat safely. °· Ask your health care provider any questions you have about caring for yourself or your baby. Make sure that you are able to contact your health care provider with any questions after leaving the hospital. °This information is not intended to replace advice given to you by your health care provider. Make sure you discuss any questions you have with your health care provider. °Document Released: 08/08/2007 Document Revised: 03/15/2016 Document Reviewed: 09/15/2015 °Elsevier Interactive Patient Education © 2018 Elsevier Inc. ° ° °Preeclampsia and Eclampsia °Preeclampsia is a serious condition that develops only during pregnancy. It is also called toxemia of pregnancy. This condition causes high blood pressure along with other symptoms, such as swelling and headaches. These symptoms may develop as the condition gets worse. Preeclampsia may occur at 20 weeks of pregnancy or later. °Diagnosing and treating preeclampsia early is very important. If not treated early, it can cause serious problems for you and your baby. One problem it can lead to is eclampsia, which is a condition that causes muscle jerking or shaking (convulsions or seizures) in the mother. Delivering your baby is the best treatment for preeclampsia or eclampsia. Preeclampsia and eclampsia symptoms usually go away after your baby is  born. °What are the causes? °The cause of preeclampsia is not known. °What increases the risk? °The following risk factors make you more likely to develop preeclampsia: °· Being pregnant for the first time. °· Having had preeclampsia during a past pregnancy. °· Having a family history of preeclampsia. °· Having high blood pressure. °· Being pregnant with twins or triplets. °· Being 35 or older. °· Being African-American. °· Having kidney disease or diabetes. °· Having medical conditions such as lupus or blood diseases. °· Being very overweight (obese). ° °What are the signs or symptoms? °The earliest signs of preeclampsia are: °· High blood pressure. °· Increased protein in your urine. Your health care provider will check for this at every visit before you give birth (prenatal visit). ° °Other symptoms that may develop as the condition gets worse include: °· Severe headaches. °· Sudden weight gain. °· Swelling of the hands, face, legs, and feet. °· Nausea and vomiting. °· Vision problems, such as blurred or double vision. °· Numbness in the face, arms, legs, and feet. °·   Urinating less than usual. °· Dizziness. °· Slurred speech. °· Abdominal pain, especially upper abdominal pain. °· Convulsions or seizures. ° °Symptoms generally go away after giving birth. °How is this diagnosed? °There are no screening tests for preeclampsia. Your health care provider will ask you about symptoms and check for signs of preeclampsia during your prenatal visits. You may also have tests that include: °· Urine tests. °· Blood tests. °· Checking your blood pressure. °· Monitoring your baby’s heart rate. °· Ultrasound. ° °How is this treated? °You and your health care provider will determine the treatment approach that is best for you. Treatment may include: °· Having more frequent prenatal exams to check for signs of preeclampsia, if you have an increased risk for preeclampsia. °· Bed rest. °· Reducing how much salt (sodium) you  eat. °· Medicine to lower your blood pressure. °· Staying in the hospital, if your condition is severe. There, treatment will focus on controlling your blood pressure and the amount of fluids in your body (fluid retention). °· You may need to take medicine (magnesium sulfate) to prevent seizures. This medicine may be given as an injection or through an IV tube. °· Delivering your baby early, if your condition gets worse. You may have your labor started with medicine (induced), or you may have a cesarean delivery. ° °Follow these instructions at home: °Eating and drinking ° °· Drink enough fluid to keep your urine clear or pale yellow. °· Eat a healthy diet that is low in sodium. Do not add salt to your food. Check nutrition labels to see how much sodium a food or beverage contains. °· Avoid caffeine. °Lifestyle °· Do not use any products that contain nicotine or tobacco, such as cigarettes and e-cigarettes. If you need help quitting, ask your health care provider. °· Do not use alcohol or drugs. °· Avoid stress as much as possible. Rest and get plenty of sleep. °General instructions °· Take over-the-counter and prescription medicines only as told by your health care provider. °· When lying down, lie on your side. This keeps pressure off of your baby. °· When sitting or lying down, raise (elevate) your feet. Try putting some pillows underneath your lower legs. °· Exercise regularly. Ask your health care provider what kinds of exercise are best for you. °· Keep all follow-up and prenatal visits as told by your health care provider. This is important. °How is this prevented? °To prevent preeclampsia or eclampsia from developing during another pregnancy: °· Get proper medical care during pregnancy. Your health care provider may be able to prevent preeclampsia or diagnose and treat it early. °· Your health care provider may have you take a low-dose aspirin or a calcium supplement during your next pregnancy. °· You may  have tests of your blood pressure and kidney function after giving birth. °· Maintain a healthy weight. Ask your health care provider for help managing weight gain during pregnancy. °· Work with your health care provider to manage any long-term (chronic) health conditions you have, such as diabetes or kidney problems. ° °Contact a health care provider if: °· You gain more weight than expected. °· You have headaches. °· You have nausea or vomiting. °· You have abdominal pain. °· You feel dizzy or light-headed. °Get help right away if: °· You develop sudden or severe swelling anywhere in your body. This usually happens in the legs. °· You gain 5 lbs (2.3 kg) or more during one week. °· You have severe: °? Abdominal pain. °?   Dizziness. ? Vision problems. ? Confusion. ? Nausea or vomiting.  You have a seizure.  You have trouble moving any part of your body.  You develop numbness in any part of your body.  You have trouble speaking.  You have any abnormal bleeding.  You pass out. This information is not intended to replace advice given to you by your health care provider. Make sure you discuss any questions you have with your health care provider. Document Released: 10/08/2000 Document Revised: 06/08/2016 Document Reviewed: 05/17/2016 Elsevier Interactive Patient Education  [employer]2018 Else[employer] Inc.

## 2018-02-05 NOTE — Progress Notes (Signed)
CSW spoke with patient to discuss positive UDS for marijuana in October 2018. Patient UDS not collected upon admission to [facility] for delivery. Patient reported smoking marijuana only one time. Patient stated she is not concerned for any further findings from the cord tissue test results. CSW informed her of hospital drug screening policy and mandatory reporting to DSS, stated understanding. Full assessment not completed due to patient being discharged from hospital at this time.  Christie Morrow, MSW, LCSW-A Clinical Social Worker [facility] [phone]   

## 2018-02-05 NOTE — Discharge Summary (Signed)
OB Discharge Summary     Patient Name: Christie Morrow DOB: Oct 04, 1981 MRN: [id number]  Date of admission: 02/02/2018 Delivering MD: Myrene Buddy   Date of discharge: 02/05/2018  Admitting diagnosis: INDUCTION Intrauterine pregnancy: [redacted]w[redacted]d     Secondary diagnosis:  Active Problems:   NSVD (normal spontaneous vaginal delivery)  Additional problems: Preeclampsia     Discharge diagnosis: Preterm Pregnancy Delivered and Preeclampsia (mild)                                                                                                Post partum procedures:none  Augmentation: AROM and Pitocin  Complications: None  Hospital course:  Induction of Labor With Vaginal Delivery   37 y.o. yo G3P1011 at [redacted]w[redacted]d was admitted to the hospital 02/02/2018 for induction of labor.  Indication for induction: Preeclampsia and CHTN.  Patient had an uncomplicated labor course as follows: Membrane Rupture Time/Date: 6:24 AM ,02/03/2018   Intrapartum Procedures: Episiotomy: None [1]                                         Lacerations:  1st degree [2];Perineal [11]  Patient had delivery of a Viable infant.  Information for the patient's newborn:  Shawnell, Dykes [[id number]]  Delivery Method: Vag-Spont   02/03/2018  Details of delivery can be found in separate delivery note.  Patient had a routine postpartum course. Patient is discharged home 02/05/18.  Physical exam  Vitals:   02/04/18 1535 02/04/18 2003 02/04/18 2333 02/05/18 0345  BP: 128/68 139/77 114/63 139/77  Pulse: (!) 101 (!) 110 91 90  Resp: (!) 23 20 20 20   Temp: 98 F (36.7 C) 98.4 F (36.9 C) 99.2 F (37.3 C) 98.8 F (37.1 C)  TempSrc: Oral Oral Oral Oral  SpO2: 97% 99% 99% 97%  Weight:      Height:       General: alert, cooperative and no distress Lochia: appropriate Uterine Fundus: firm Incision: N/A DVT Evaluation: No evidence of DVT seen on physical exam. Labs: Lab Results  Component Value Date   WBC 13.9 (H)  02/04/2018   HGB 9.2 (L) 02/04/2018   HCT 26.6 (L) 02/04/2018   MCV 96.0 02/04/2018   PLT 275 02/04/2018   CMP Latest Ref Rng & Units 02/04/2018  Glucose 65 - 99 mg/dL 782(N)  BUN 6 - 20 mg/dL 6  Creatinine 5.62 - 1.30 mg/dL 8.65  Sodium 784 - 696 mmol/L 133(L)  Potassium 3.5 - 5.1 mmol/L 3.7  Chloride 101 - 111 mmol/L 101  CO2 22 - 32 mmol/L 21(L)  Calcium 8.9 - 10.3 mg/dL 6.9(L)  Total Protein 6.5 - 8.1 g/dL 5.9(L)  Total Bilirubin 0.3 - 1.2 mg/dL 0.3  Alkaline Phos 38 - 126 U/L 114  AST 15 - 41 U/L 22  ALT 14 - 54 U/L 17    Discharge instruction: per After Visit Summary and "Baby and Me Booklet".  After visit meds:  Allergies as of 02/05/2018   No Known Allergies  Medication List    TAKE these medications   aspirin 81 MG chewable tablet Chew 81 mg by mouth daily.   buPROPion 300 MG 24 hr tablet Commonly known as:  WELLBUTRIN XL Take 1 tablet (300 mg total) by mouth daily.   cyclobenzaprine 10 MG tablet Commonly known as:  FLEXERIL Take 1 tablet (10 mg total) by mouth every 8 (eight) hours as needed for muscle spasms.   diphenhydramine-acetaminophen 25-500 MG Tabs tablet Commonly known as:  TYLENOL PM Take 2 tablets by mouth at bedtime as needed (for sleep/pain).   DULoxetine 60 MG capsule Commonly known as:  CYMBALTA Take 1 capsule (60 mg total) by mouth daily.   ibuprofen 600 MG tablet Commonly known as:  ADVIL,MOTRIN Take 1 tablet (600 mg total) by mouth every 6 (six) hours.   multivitamin-prenatal 27-0.8 MG Tabs tablet Take 1 tablet by mouth daily at 12 noon.   omeprazole 20 MG capsule Commonly known as:  PRILOSEC Take 1 capsule (20 mg total) by mouth daily.   valACYclovir 500 MG tablet Commonly known as:  VALTREX Take 1 tablet (500 mg total) by mouth 2 (two) times daily.       Diet: routine diet  Activity: Advance as tolerated. Pelvic rest for 6 weeks.   Outpatient follow up:4 days Follow up Appt: Future Appointments  Date Time  Provider Department Center  02/09/2018  1:45 PM Cresenzo-Dishmon, Scarlette CalicoFrances, CNM [facility]   Follow up Visit:No follow-ups on file.  Postpartum contraception: Not Discussed  Newborn Data: Live born female  Birth Weight: 6 lb 5.1 oz (2865 g) APGAR: 9, 9  Newborn Delivery   Birth date/time:  02/03/2018 13:29:00 Delivery type:  Vaginal, Spontaneous     Baby Feeding: Bottle Disposition:home with mother   02/05/2018 Scheryl DarterJames Anaisa Radi, MD

## 2018-02-06 ENCOUNTER — Telehealth: Payer: Self-pay | Admitting: *Deleted

## 2018-02-06 ENCOUNTER — Encounter: Payer: Medicaid Other | Admitting: Obstetrics and Gynecology

## 2018-02-06 ENCOUNTER — Other Ambulatory Visit: Payer: Medicaid Other

## 2018-02-06 NOTE — Telephone Encounter (Signed)
Lmom for pt to call us back to sch pp appointment.  02-06-18  AS

## 2018-02-07 ENCOUNTER — Telehealth: Payer: Self-pay | Admitting: *Deleted

## 2018-02-07 ENCOUNTER — Telehealth: Payer: Self-pay | Admitting: Obstetrics & Gynecology

## 2018-02-07 MED ORDER — OXYCODONE-ACETAMINOPHEN 5-325 MG PO TABS: 1.0000 | ORAL_TABLET | ORAL | 0 refills | Status: DC | PRN

## 2018-02-07 NOTE — Telephone Encounter (Signed)
Patient came into the office with complaints of "horrible lower back pain" where she has a disk disorder. She is taking the Ibuprofen around the clock along with Flexeril and has also tried heat and ice.  She is requesting something a little stronger "just for a couple of days".  Please advise.

## 2018-02-07 NOTE — Telephone Encounter (Signed)
Informed patient Dr Despina HiddenEure sent 20 tablets of percocet to pharmacy.  Verbalized gratitude.

## 2018-02-07 NOTE — Telephone Encounter (Signed)
done

## 2018-02-09 ENCOUNTER — Ambulatory Visit (INDEPENDENT_AMBULATORY_CARE_PROVIDER_SITE_OTHER): Payer: Medicaid Other | Admitting: Women's Health

## 2018-02-09 ENCOUNTER — Other Ambulatory Visit: Payer: Medicaid Other | Admitting: Advanced Practice Midwife

## 2018-02-09 VITALS — BP 136/78

## 2018-02-09 DIAGNOSIS — R51 Headache: Secondary | ICD-10-CM

## 2018-02-09 DIAGNOSIS — H43393 Other vitreous opacities, bilateral: Secondary | ICD-10-CM | POA: Diagnosis not present

## 2018-02-09 DIAGNOSIS — N853 Subinvolution of uterus: Secondary | ICD-10-CM | POA: Diagnosis not present

## 2018-02-09 DIAGNOSIS — D649 Anemia, unspecified: Secondary | ICD-10-CM | POA: Diagnosis not present

## 2018-02-09 DIAGNOSIS — Z0130 Encounter for examination of blood pressure without abnormal findings: Secondary | ICD-10-CM

## 2018-02-09 LAB — POCT HEMOGLOBIN: Hemoglobin: 8.6 g/dL — AB (ref 12.2–16.2)

## 2018-02-09 MED ORDER — FERROUS SULFATE 325 (65 FE) MG PO TABS: 325.0000 mg | ORAL_TABLET | Freq: Two times a day (BID) | ORAL | 3 refills | Status: DC

## 2018-02-09 MED ORDER — MISOPROSTOL 200 MCG PO TABS: ORAL_TABLET | ORAL | 0 refills | Status: DC

## 2018-02-09 NOTE — Progress Notes (Signed)
Pt in for BP check today following recent delivery on 02/03/18. She was induced for superimposed Pre-E. Not discharged on any BP meds. She reports a headache for 3 days.  Pt reports large blood clot yesterday. She is having cramping.  She is feeling weak and cold. She also thinks a stitch fell out.

## 2018-02-11 NOTE — Progress Notes (Signed)
   GYN VISIT Patient name: Tobey GrimLanna G Adkison MRN [id number]  Date of birth: 1981-10-15 Chief Complaint:   Blood Pressure Check  History of Present Illness:   Tobey GrimLanna G Berhane is a 37 y.o. [phone] Caucasian female 6d s/p SVB after IOL for CHTN w/ super-imposed severe pre-e at 36.3wks, being seen today initially for bp check w/ nurse. BP was normal, however pt reports heavy bleeding since delivery, passing large clots the size of her hand, paleness, headache, and occ seeing floaters. Not on any bp meds. Received mag intrapartum and 24hr pp. Had PPH w/ EBL 800ml, received cytotec and TXA at birth. Wearing depends, changes every couple of hours or so. Not taking Fe. Hgb 9.2 on d/c, was 12.4 on admit.   Patient's last menstrual period was 05/24/2017 (exact date). Review of Systems:   Pertinent items are noted in HPI Denies fever/chills, dizziness, headaches, visual disturbances, fatigue, shortness of breath, chest pain, abdominal pain, vomiting, abnormal vaginal discharge/itching/odor/irritation, problems with periods, bowel movements, urination, or intercourse unless otherwise stated above.  Pertinent History Reviewed:  Reviewed past medical,surgical, social, obstetrical and family history.  Reviewed problem list, medications and allergies. Physical Assessment:   Vitals:   02/09/18 1436  BP: 136/78  There is no height or weight on file to calculate BMI.       Physical Examination:   General appearance: alert, well appearing, and in no distress  Mental status: alert, oriented to person, place, and time  Skin: warm & dry   Cardiovascular: normal heart rate noted  Respiratory: normal respiratory effort, no distress  Abdomen: soft, non-tender   Pelvic: VULVA: normal appearing vulva with no masses, tenderness or lesions, UTERUS: slightly boggy, vigorous massage, w/ continuous trickle of blood. Co-exam w/ LHE who also performed bimanual. Recommends cytotec 200mcg TID x 1d, then BID x3d, f/u  Tues  Extremities: no edema   No results found for this or any previous visit (from the past 24 hour(s)).  Assessment & Plan:  1) 6d s/p SVB after IOL for CHTN w/ superimposed severe pre-e> bottlefeeding  2) BP check> bp normal, headache/floaters, s/p intrapartum mag and 24hr pp, per LHE, no further f/u needed  3) Slightly boggy uterus/sub-involution w/ increased vaginal bleeding> Hgb fingerstick 8.4, per LHE, rx cytotec 200mcg TID x 1d, then BID x 3d. Rx Fe BID. F/U Tues. Discussed warning s/s, reasons to seek care   Meds:  Meds ordered this encounter  Medications  . misoprostol (CYTOTEC) 200 MCG tablet    Sig: 200mcg TID x 1day, then BID x 3 days    Dispense:  9 tablet    Refill:  0    Order Specific Question:   Supervising Provider    Answer:   Despina HiddenEURE, LUTHER H [[id number]]  . ferrous sulfate 325 (65 FE) MG tablet    Sig: Take 1 tablet (325 mg total) by mouth 2 (two) times daily with a meal.    Dispense:  60 tablet    Refill:  3    Order Specific Question:   Supervising Provider    Answer:   Duane LopeEURE, LUTHER H [[id number]]    Orders Placed This Encounter  Procedures  . POCT hemoglobin    Return for Tues for f/u.  Cheral MarkerKimberly R Mikel Hardgrove CNM, WHNP-BC

## 2018-02-14 ENCOUNTER — Encounter: Payer: Medicaid Other | Admitting: Advanced Practice Midwife

## 2018-02-15 ENCOUNTER — Ambulatory Visit (INDEPENDENT_AMBULATORY_CARE_PROVIDER_SITE_OTHER): Payer: Medicaid Other | Admitting: Advanced Practice Midwife

## 2018-02-15 ENCOUNTER — Encounter: Payer: Self-pay | Admitting: Advanced Practice Midwife

## 2018-02-15 VITALS — BP 130/92 | HR 121 | Wt 282.0 lb

## 2018-02-15 DIAGNOSIS — Z0130 Encounter for examination of blood pressure without abnormal findings: Secondary | ICD-10-CM | POA: Diagnosis not present

## 2018-02-15 DIAGNOSIS — D649 Anemia, unspecified: Secondary | ICD-10-CM | POA: Diagnosis not present

## 2018-02-15 LAB — POCT HEMOGLOBIN: Hemoglobin: 9.6 g/dL — AB (ref 12.2–16.2)

## 2018-02-15 MED ORDER — CYCLOBENZAPRINE HCL 10 MG PO TABS: 10.0000 mg | ORAL_TABLET | Freq: Three times a day (TID) | ORAL | 3 refills | Status: DC | PRN

## 2018-02-15 MED ORDER — HYDROCHLOROTHIAZIDE 25 MG PO TABS: 25.0000 mg | ORAL_TABLET | Freq: Every day | ORAL | 3 refills | Status: DC

## 2018-02-15 NOTE — Progress Notes (Signed)
[facility] Visit  Patient name: Christie Morrow MRN [id number]  Date of birth: 1981-04-12  CC & HPI:  Christie GrimLanna G Lauture is a 37 y.o.  female presenting today for BP check. Has CHTN (lasix ??? Prior to preg)and developed severe PreE, delivered on 4/12.  Seen in ED 4/18 for HA, floaters, and some extra bleeding. BP was not high enough for meds, given cytotec for a few days.Bleeding almost gone.    Pertinent History Reviewed:  Medical & Surgical Hx:   Past Medical History:  Diagnosis Date  . Arthritis   . Disc disorder   . Fibromyalgia   . Mental disorder    depression/anxiety  . Vaginal Pap smear, abnormal    Past Surgical History:  Procedure Laterality Date  . CRYOTHERAPY    . FLEXIBLE SIGMOIDOSCOPY  06/11/2011   Procedure: FLEXIBLE SIGMOIDOSCOPY;  Surgeon: Malissa HippoNajeeb U Rehman, MD;  Location: [facility];  Service: Endoscopy;  Laterality: N/A;  3:30  . WISDOM TOOTH EXTRACTION  2003   Family History  Problem Relation Age of Onset  . Heart disease Father   . Multiple sclerosis Father   . Stroke Father   . Cancer Maternal Grandmother        breast  . Heart disease Maternal Grandmother   . Heart disease Maternal Grandfather   . Heart disease Paternal Grandmother   . Heart disease Paternal Grandfather     Current Outpatient Medications:  .  buPROPion (WELLBUTRIN XL) 300 MG 24 hr tablet, Take 1 tablet (300 mg total) by mouth daily., Disp: 30 tablet, Rfl: 6 .  cyclobenzaprine (FLEXERIL) 10 MG tablet, Take 1 tablet (10 mg total) by mouth every 8 (eight) hours as needed for muscle spasms., Disp: 30 tablet, Rfl: 3 .  DULoxetine (CYMBALTA) 60 MG capsule, Take 1 capsule (60 mg total) by mouth daily., Disp: 30 capsule, Rfl: 6 .  ferrous sulfate 325 (65 FE) MG tablet, Take 1 tablet (325 mg total) by mouth 2 (two) times daily with a meal., Disp: 60 tablet, Rfl: 3 .  ibuprofen (ADVIL,MOTRIN) 600 MG tablet, Take 1 tablet (600 mg total) by mouth every 6 (six) hours., Disp: 30 tablet, Rfl:  0 .  omeprazole (PRILOSEC) 20 MG capsule, Take 1 capsule (20 mg total) by mouth daily., Disp: 30 capsule, Rfl: 6 .  oxyCODONE-acetaminophen (PERCOCET/ROXICET) 5-325 MG tablet, Take 1 tablet by mouth every 4 (four) hours as needed for severe pain., Disp: 20 tablet, Rfl: 0 .  Prenatal Vit-Fe Fumarate-FA (MULTIVITAMIN-PRENATAL) 27-0.8 MG TABS tablet, Take 1 tablet by mouth daily at 12 noon., Disp: , Rfl:  .  valACYclovir (VALTREX) 500 MG tablet, Take 1 tablet (500 mg total) by mouth 2 (two) times daily., Disp: 60 tablet, Rfl: 6 .  aspirin 81 MG chewable tablet, Chew 81 mg by mouth daily., Disp: , Rfl:  .  diphenhydramine-acetaminophen (TYLENOL PM) 25-500 MG TABS tablet, Take 2 tablets by mouth at bedtime as needed (for sleep/pain)., Disp: , Rfl:  .  hydrochlorothiazide (HYDRODIURIL) 25 MG tablet, Take 1 tablet (25 mg total) by mouth daily., Disp: 30 tablet, Rfl: 3 Social History: Reviewed -  reports that she has never smoked. She has never used smokeless tobacco.  Review of Systems:   Constitutional: Negative for fever and chills Eyes: Negative for visual disturbances Respiratory: Negative for shortness of breath, dyspnea Cardiovascular: Negative for chest pain or palpitations  Gastrointestinal: Negative for vomiting, diarrhea and constipation; no abdominal pain Genitourinary: Negative for dysuria and urgency, vaginal  irritation or itching Musculoskeletal: Negative for back pain, joint pain, myalgias  Neurological: Negative for dizziness and headaches    Objective Findings:    Physical Examination: Vitals:   02/15/18 1217 02/15/18 1220  BP: (!) 120/94 (!) 130/92  Pulse: (!) 121    General appearance - well appearing, and in no distress Mental status - alert, oriented to person, place, and time Chest:  Normal respiratory effort Heart - normal rate and regular rhythm Abdomen:  Soft, nontender Pelvic: deferred Musculoskeletal:  Normal range of motion without pain Extremities:  No  edema  hgb 9.4  Results for orders placed or performed in visit on 02/15/18 (from the past 24 hour(s))  POCT hemoglobin   Collection Time: 02/15/18  2:43 PM  Result Value Ref Range   Hemoglobin 9.6 (A) 12.2 - 16.2 g/dL      Assessment & Plan:  A:   CHTN, not on meds P:  rx HCTZ 25mg   Return in about 3 weeks (around 03/08/2018) for postpartum and 5 weeks for IUD.  Jacklyn Shell CNM 02/15/2018 2:48 PM

## 2018-02-20 ENCOUNTER — Encounter: Payer: Self-pay | Admitting: Family Medicine

## 2018-02-20 ENCOUNTER — Ambulatory Visit: Payer: Medicaid Other | Admitting: Family Medicine

## 2018-02-20 VITALS — BP 118/80 | Temp 99.0°F | Ht 67.0 in | Wt 278.0 lb

## 2018-02-20 DIAGNOSIS — J020 Streptococcal pharyngitis: Secondary | ICD-10-CM

## 2018-02-20 DIAGNOSIS — J029 Acute pharyngitis, unspecified: Secondary | ICD-10-CM | POA: Diagnosis not present

## 2018-02-20 LAB — POCT RAPID STREP A (OFFICE): RAPID STREP A SCREEN: POSITIVE — AB

## 2018-02-20 MED ORDER — AZITHROMYCIN 250 MG PO TABS: ORAL_TABLET | ORAL | 0 refills | Status: DC

## 2018-02-20 NOTE — Progress Notes (Signed)
   Subjective:    Patient ID: Christie Morrow, female    DOB: [birth year], 37 y.o.   MRN: [id number]  HPI Patient is here today with complaints of a sore throat and bialteral ear pain and headache ongoing since Friday.  Friday it started yest got really bad  Painful sore throat   rough pain   n fever no chills   yusing mucinex and tyleno     Non soke pos for m fed [place]   Pt had sig anemia      Review of Systems Testing no vomiting no diarrhea no rash    Objective:   Physical Exam  Alert active good hydration.  HEENT1 impressive erythema throat.  Tender anterior nodes.  Neck supple.  Lungs clear.  Heart regular rate and rhythm.  Moderate malaise  Results for orders placed or performed in visit on 02/20/18  POCT rapid strep A  Result Value Ref Range   Rapid Strep A Screen Positive (A) Negative   Impression strep throat.  Discussed.  Antibiotics prescribed symptom care discussed.  Warning signs discussed    Assessment & Plan:  1

## 2018-02-22 ENCOUNTER — Other Ambulatory Visit: Payer: Self-pay | Admitting: Advanced Practice Midwife

## 2018-03-02 ENCOUNTER — Other Ambulatory Visit: Payer: Self-pay | Admitting: *Deleted

## 2018-03-02 MED ORDER — CEFPROZIL 500 MG PO TABS: 500.0000 mg | ORAL_TABLET | Freq: Two times a day (BID) | ORAL | 0 refills | Status: DC

## 2018-03-06 ENCOUNTER — Other Ambulatory Visit: Payer: Self-pay | Admitting: Advanced Practice Midwife

## 2018-03-08 ENCOUNTER — Ambulatory Visit: Payer: Medicaid Other | Admitting: Advanced Practice Midwife

## 2018-03-13 ENCOUNTER — Ambulatory Visit: Payer: Medicaid Other | Admitting: Nurse Practitioner

## 2018-03-16 ENCOUNTER — Ambulatory Visit: Payer: Medicaid Other | Admitting: Women's Health

## 2018-03-17 ENCOUNTER — Ambulatory Visit: Payer: Medicaid Other | Admitting: Nurse Practitioner

## 2018-03-17 ENCOUNTER — Encounter: Payer: Self-pay | Admitting: Nurse Practitioner

## 2018-03-17 VITALS — BP 130/84 | Ht 67.0 in | Wt 293.0 lb

## 2018-03-17 DIAGNOSIS — F418 Other specified anxiety disorders: Secondary | ICD-10-CM

## 2018-03-17 MED ORDER — CYCLOBENZAPRINE HCL 10 MG PO TABS: ORAL_TABLET | ORAL | 2 refills | Status: DC

## 2018-03-17 MED ORDER — ALPRAZOLAM 1 MG PO TABS: ORAL_TABLET | ORAL | 2 refills | Status: DC

## 2018-03-17 MED ORDER — BUPROPION HCL ER (XL) 150 MG PO TB24: 150.0000 mg | ORAL_TABLET | Freq: Every day | ORAL | 2 refills | Status: DC

## 2018-03-18 ENCOUNTER — Encounter: Payer: Self-pay | Admitting: Nurse Practitioner

## 2018-03-18 NOTE — Progress Notes (Signed)
Subjective:  Presents for recheck of her depression and anxiety. Has had some post partum depression but nothing severe. She is not breastfeeding. She would like to go back up on her Wellbutrin and restart Xanax as previously ordered before her pregnancy. Living with her parents but will be moving into her new home in [place] this summer. Doing well on Cymbalta. No longer on sleeping pill or pain medication.   Objective:   BP 130/84   Ht  (1.702 m)   Wt 293 lb (132.9 kg)   BMI 45.89 kg/m  NAD. Alert, oriented. Lungs clear. Heart RRR. Mildly anxious affect. Thoughts logical, coherent and relevant. Dressed appropriately. Making good eye contact.   Assessment:   Problem List Items Addressed This Visit      Other   Depression with anxiety - Primary   Relevant Medications   buPROPion (WELLBUTRIN XL) 150 MG 24 hr tablet   ALPRAZolam (XANAX) 1 MG tablet       Plan:   Meds ordered this encounter  Medications  . buPROPion (WELLBUTRIN XL) 150 MG 24 hr tablet    Sig: Take 1 tablet (150 mg total) by mouth daily.    Dispense:  30 tablet    Refill:  2    Order Specific Question:   Supervising Provider    Answer:   Merlyn Albert [[id number]]  . ALPRAZolam (XANAX) 1 MG tablet    Sig: Take 1/2 tab po BID prn anxiety    Dispense:  30 tablet    Refill:  2    May refill monthly    Order Specific Question:   Supervising Provider    Answer:   Merlyn Albert [[id number]]  . cyclobenzaprine (FLEXERIL) 10 MG tablet    Sig: Take one tab po qhs prn muscle spasms    Dispense:  30 tablet    Refill:  2    Order Specific Question:   Supervising Provider    Answer:   Merlyn Albert [[id number]]   Also refill on Flexeril for night time as needed.  Increase total Wellbutrin dose back up to 450 mg.  Return in about 4 months (around 07/18/2018) for recheck. Call back sooner if any problems.

## 2018-03-22 ENCOUNTER — Ambulatory Visit: Payer: Medicaid Other | Admitting: Women's Health

## 2018-03-27 ENCOUNTER — Encounter: Payer: Self-pay | Admitting: Nurse Practitioner

## 2018-03-30 ENCOUNTER — Ambulatory Visit: Payer: Medicaid Other | Admitting: Women's Health

## 2018-04-04 ENCOUNTER — Encounter: Payer: Self-pay | Admitting: Women's Health

## 2018-04-04 ENCOUNTER — Encounter

## 2018-04-04 ENCOUNTER — Ambulatory Visit (INDEPENDENT_AMBULATORY_CARE_PROVIDER_SITE_OTHER): Payer: Medicaid Other | Admitting: Women's Health

## 2018-04-04 DIAGNOSIS — Z862 Personal history of diseases of the blood and blood-forming organs and certain disorders involving the immune mechanism: Secondary | ICD-10-CM

## 2018-04-04 DIAGNOSIS — N920 Excessive and frequent menstruation with regular cycle: Secondary | ICD-10-CM

## 2018-04-04 DIAGNOSIS — Z8759 Personal history of other complications of pregnancy, childbirth and the puerperium: Secondary | ICD-10-CM | POA: Insufficient documentation

## 2018-04-04 LAB — POCT HEMOGLOBIN: HEMOGLOBIN: 12.5 g/dL (ref 12.2–16.2)

## 2018-04-04 NOTE — Progress Notes (Signed)
   POSTPARTUM VISIT Patient name: Christie Morrow Capozzoli MRN [id number]  Date of birth: 1980/11/24 Chief Complaint:   Postpartum Care (want hgb chech)  History of Present Illness:   Christie Morrow Vitanza is a 37 y.o. [phone] Caucasian female being seen today for a postpartum visit. She is 7 weeks postpartum following a spontaneous vaginal delivery at 36.3 gestational weeks [facility]. Anesthesia: epidural. I have fully reviewed the prenatal and intrapartum course. Delivery complicated by PPH, EBL 86400ml>received cytotec and TXA. [facility], then superimposed pre-e. Postpartum course has been uncomplicated. Bleeding no bleeding. Bowel function is normal. Bladder function is normal. Has had 2 periods, last one was kind of heavy, wants hgb checked. Patient is not sexually active. Last sexual activity: prior to birth of baby.  Contraception method is scheduled tomorrow for IUD.  [place] Postpartum Depression Screening: negative. Score 5.   Last pap 01/10/17.  Results were neg pap w/ +HRHPV .  Patient's last menstrual period was 03/29/2018.  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 Baby is feeding by bottle.  Review of Systems:   Pertinent items are noted in HPI Denies Abnormal vaginal discharge w/ itching/odor/irritation, headaches, visual changes, shortness of breath, chest pain, abdominal pain, severe nausea/vomiting, or problems with urination or bowel movements. Pertinent History Reviewed:  Reviewed past medical,surgical, obstetrical and family history.  Reviewed problem list, medications and allergies. OB History  Gravida Para Term Preterm AB Living  3 2 1 1 1 2   SAB TAB Ectopic Multiple Live Births  1     0 2    # Outcome Date GA Lbr Len/2nd Weight Sex Delivery Anes PTL Lv  3 Preterm 02/03/18 [id number] 06:53 / 00:12 6 lb 5.1 oz (2.865 kg) F Vag-Spont EPI  LIV     Birth Comments: wnl  2 Term 06/28/10 6967w0d  9 lb 5 oz  (4.224 kg) F Vag-Spont EPI N LIV     Birth Comments: PPH  1 SAB            Physical Assessment:   Vitals:   04/04/18 1509  BP: 137/82  Pulse: (!) 115  Weight: 278 lb (126.1 kg)  Height: 5\' 7"  (1.702 m)  Body mass index is 43.54 kg/m.       Physical Examination:   General appearance: alert, well appearing, and in no distress  Mental status: alert, oriented to person, place, and time  Skin: warm & dry   Cardiovascular: normal heart rate noted   Respiratory: normal respiratory effort, no distress   Breasts: deferred, no complaints   Abdomen: soft, non-tender   Pelvic: VULVA: normal appearing vulva with no masses, tenderness or lesions, UTERUS: uterus is normal size, shape, consistency and nontender  Rectal: no hemorrhoids  Extremities: no edema       No results found for this or any previous visit (from the past 24 hour(s)).  Assessment & Plan:  1) Postpartum exam 2) 7 wks s/p SVB after IOL for CHTN w/ SI pre-e 3) Bottlefeeding 4) Depression screening 5) Contraception counseling, pt prefers IUD, abstinence until after insertion  Meds: No orders of the defined types were placed in this encounter.   Follow-up: Return for As scheduled tomorrow for , IUD insertion.   Orders Placed This Encounter  Procedures  . POCT hemoglobin    Cheral MarkerKimberly R Booker CNM, [facility] 04/04/2018 3:48 PM

## 2018-04-05 ENCOUNTER — Ambulatory Visit (INDEPENDENT_AMBULATORY_CARE_PROVIDER_SITE_OTHER): Payer: Medicaid Other | Admitting: Women's Health

## 2018-04-05 ENCOUNTER — Encounter: Payer: Self-pay | Admitting: Women's Health

## 2018-04-05 VITALS — BP 124/76 | HR 96 | Ht 67.0 in | Wt 277.0 lb

## 2018-04-05 DIAGNOSIS — Z30430 Encounter for insertion of intrauterine contraceptive device: Secondary | ICD-10-CM | POA: Diagnosis not present

## 2018-04-05 DIAGNOSIS — Z3202 Encounter for pregnancy test, result negative: Secondary | ICD-10-CM | POA: Diagnosis not present

## 2018-04-05 LAB — POCT URINE PREGNANCY: PREG TEST UR: NEGATIVE

## 2018-04-05 MED ORDER — LEVONORGESTREL 19.5 MCG/DAY IU IUD
INTRAUTERINE_SYSTEM | Freq: Once | INTRAUTERINE | Status: AC
  Administered 2018-04-05: 17:00:00 via INTRAUTERINE

## 2018-04-05 NOTE — Progress Notes (Signed)
   IUD INSERTION Patient name: Christie Morrow MRN [id number]  Date of birth: 1981/07/20 Subjective Findings:   Christie Morrow is a 37 y.o. [phone] Caucasian female being seen today for insertion of a Liletta IUD.   Patient's last menstrual period was 03/29/2018. Last sexual intercourse was prior to birth of baby Last pap3/19/18. Results were:  neg pap w/ +HRHPV  The risks and benefits of the method and placement have been thouroughly reviewed with the patient and all questions were answered.  Specifically the patient is aware of failure rate of 10/998, expulsion of the IUD and of possible perforation.  The patient is aware of irregular bleeding due to the method and understands the incidence of irregular bleeding diminishes with time.  Signed copy of informed consent in chart.  Pertinent History Reviewed:   Reviewed past medical,surgical, social, obstetrical and family history.  Reviewed problem list, medications and allergies. Objective Findings & Procedure:   Vitals:   04/05/18 1507  BP: 124/76  Pulse: 96  Weight: 277 lb (125.6 kg)  Height: 5\' 7"  (1.702 m)  Body mass index is 43.38 kg/m.  Results for orders placed or performed in visit on 04/05/18 (from the past 24 hour(s))  POCT urine pregnancy   Collection Time: 04/05/18  3:14 PM  Result Value Ref Range   Preg Test, Ur Negative Negative  Results for orders placed or performed in visit on 04/04/18 (from the past 24 hour(s))  POCT hemoglobin   Collection Time: 04/04/18  3:53 PM  Result Value Ref Range   Hemoglobin 12.5 12.2 - 16.2 g/dL     Time out was performed.  A graves speculum was placed in the vagina.  The cervix was visualized, prepped using Betadine, and grasped with a single tooth tenaculum. The uterus was found to be VERY retroflexed with VERY anterior cx and it sounded to 10 cm.  Liletta IUD placed per manufacturer's recommendations. The strings were trimmed to approximately 3 cm. The patient tolerated the procedure  well.   Informal transvaginal sonogram was performed and the proper placement of the IUD was verified. Assessment & Plan:   1) Liletta IUD insertion The patient was given post procedure instructions, including signs and symptoms of infection and to check for the strings after each menses or each month, and refraining from intercourse or anything in the vagina for 3 days. She was given a Liletta care card with date IUD placed, and date IUD to be removed. She is scheduled for a f/u appointment in 4 weeks.  Orders Placed This Encounter  Procedures  . POCT urine pregnancy    Return in about 1 month (around 05/03/2018) for Pap & physical. and IUD check  Cheral MarkerKimberly R Devlyn Retter CNM, [facility]NP-BC 04/05/2018 3:43 PM

## 2018-04-05 NOTE — Addendum Note (Signed)
Addended by: Federico FlakeNES, Takeela Peil A on: 04/05/2018 04:45 PM   Modules accepted: Orders

## 2018-04-05 NOTE — Patient Instructions (Signed)
 Nothing in vagina for 3 days (no sex, douching, tampons, etc...)  Check your strings once a month to make sure you can feel them, if you are not able to please let us know  If you develop a fever of 100.4 or more in the next few weeks, or if you develop severe abdominal pain, please let us know  Use a backup method of birth control, such as condoms, for 2 weeks    Intrauterine Device Insertion, Care After This sheet gives you information about how to care for yourself after your procedure. Your health care provider may also give you more specific instructions. If you have problems or questions, contact your health care provider. What can I expect after the procedure? After the procedure, it is common to have:  Cramps and pain in the abdomen.  Light bleeding (spotting) or heavier bleeding that is like your menstrual period. This may last for up to a few days.  Lower back pain.  Dizziness.  Headaches.  Nausea.  Follow these instructions at home:  Before resuming sexual activity, check to make sure that you can feel the IUD string(s). You should be able to feel the end of the string(s) below the opening of your cervix. If your IUD string is in place, you may resume sexual activity. ? If you had a hormonal IUD inserted more than 7 days after your most recent period started, you will need to use a backup method of birth control for 7 days after IUD insertion. Ask your health care provider whether this applies to you.  Continue to check that the IUD is still in place by feeling for the string(s) after every menstrual period, or once a month.  Take over-the-counter and prescription medicines only as told by your health care provider.  Do not drive or use heavy machinery while taking prescription pain medicine.  Keep all follow-up visits as told by your health care provider. This is important. Contact a health care provider if:  You have bleeding that is heavier or lasts longer than  a normal menstrual cycle.  You have a fever.  You have cramps or abdominal pain that get worse or do not get better with medicine.  You develop abdominal pain that is new or is not in the same area of earlier cramping and pain.  You feel lightheaded or weak.  You have abnormal or bad-smelling discharge from your vagina.  You have pain during sexual activity.  You have any of the following problems with your IUD string(s): ? The string bothers or hurts you or your sexual partner. ? You cannot feel the string. ? The string has gotten longer.  You can feel the IUD in your vagina.  You think you may be pregnant, or you miss your menstrual period.  You think you may have an STI (sexually transmitted infection). Get help right away if:  You have flu-like symptoms.  You have a fever and chills.  You can feel that your IUD has slipped out of place. Summary  After the procedure, it is common to have cramps and pain in the abdomen. It is also common to have light bleeding (spotting) or heavier bleeding that is like your menstrual period.  Continue to check that the IUD is still in place by feeling for the string(s) after every menstrual period, or once a month.  Keep all follow-up visits as told by your health care provider. This is important.  Contact your health care provider if   you have problems with your IUD string(s), such as the string getting longer or bothering you or your sexual partner. This information is not intended to replace advice given to you by your health care provider. Make sure you discuss any questions you have with your health care provider. Document Released: 06/09/2011 Document Revised: 09/01/2016 Document Reviewed: 09/01/2016 Elsevier Interactive Patient Education  2017 Elsevier Inc.  Levonorgestrel intrauterine device (IUD) What is this medicine? LEVONORGESTREL IUD (LEE voe nor jes trel) is a contraceptive (birth control) device. The device is placed  inside the uterus by a healthcare professional. It is used to prevent pregnancy. This device can also be used to treat heavy bleeding that occurs during your period. This medicine may be used for other purposes; ask your health care provider or pharmacist if you have questions. COMMON BRAND NAME(S): Kyleena, LILETTA, Mirena, Skyla What should I tell my health care provider before I take this medicine? They need to know if you have any of these conditions: -abnormal Pap smear -cancer of the breast, uterus, or cervix -diabetes -endometritis -genital or pelvic infection now or in the past -have more than one sexual partner or your partner has more than one partner -heart disease -history of an ectopic or tubal pregnancy -immune system problems -IUD in place -liver disease or tumor -problems with blood clots or take blood-thinners -seizures -use intravenous drugs -uterus of unusual shape -vaginal bleeding that has not been explained -an unusual or allergic reaction to levonorgestrel, other hormones, silicone, or polyethylene, medicines, foods, dyes, or preservatives -pregnant or trying to get pregnant -breast-feeding How should I use this medicine? This device is placed inside the uterus by a health care professional. Talk to your pediatrician regarding the use of this medicine in children. Special care may be needed. Overdosage: If you think you have taken too much of this medicine contact a poison control center or emergency room at once. NOTE: This medicine is only for you. Do not share this medicine with others. What if I miss a dose? This does not apply. Depending on the brand of device you have inserted, the device will need to be replaced every 3 to 5 years if you wish to continue using this type of birth control. What may interact with this medicine? Do not take this medicine with any of the following medications: -amprenavir -bosentan -fosamprenavir This medicine may also  interact with the following medications: -aprepitant -armodafinil -barbiturate medicines for inducing sleep or treating seizures -bexarotene -boceprevir -griseofulvin -medicines to treat seizures like carbamazepine, ethotoin, felbamate, oxcarbazepine, phenytoin, topiramate -modafinil -pioglitazone -rifabutin -rifampin -rifapentine -some medicines to treat HIV infection like atazanavir, efavirenz, indinavir, lopinavir, nelfinavir, tipranavir, ritonavir -St. John's wort -warfarin This list may not describe all possible interactions. Give your health care provider a list of all the medicines, herbs, non-prescription drugs, or dietary supplements you use. Also tell them if you smoke, drink alcohol, or use illegal drugs. Some items may interact with your medicine. What should I watch for while using this medicine? Visit your doctor or health care professional for regular check ups. See your doctor if you or your partner has sexual contact with others, becomes HIV positive, or gets a sexual transmitted disease. This product does not protect you against HIV infection (AIDS) or other sexually transmitted diseases. You can check the placement of the IUD yourself by reaching up to the top of your vagina with clean fingers to feel the threads. Do not pull on the threads. It is a good habit   to check placement after each menstrual period. Call your doctor right away if you feel more of the IUD than just the threads or if you cannot feel the threads at all. The IUD may come out by itself. You may become pregnant if the device comes out. If you notice that the IUD has come out use a backup birth control method like condoms and call your health care provider. Using tampons will not change the position of the IUD and are okay to use during your period. This IUD can be safely scanned with magnetic resonance imaging (MRI) only under specific conditions. Before you have an MRI, tell your healthcare provider that  you have an IUD in place, and which type of IUD you have in place. What side effects may I notice from receiving this medicine? Side effects that you should report to your doctor or health care professional as soon as possible: -allergic reactions like skin rash, itching or hives, swelling of the face, lips, or tongue -fever, flu-like symptoms -genital sores -high blood pressure -no menstrual period for 6 weeks during use -pain, swelling, warmth in the leg -pelvic pain or tenderness -severe or sudden headache -signs of pregnancy -stomach cramping -sudden shortness of breath -trouble with balance, talking, or walking -unusual vaginal bleeding, discharge -yellowing of the eyes or skin Side effects that usually do not require medical attention (report to your doctor or health care professional if they continue or are bothersome): -acne -breast pain -change in sex drive or performance -changes in weight -cramping, dizziness, or faintness while the device is being inserted -headache -irregular menstrual bleeding within first 3 to 6 months of use -nausea This list may not describe all possible side effects. Call your doctor for medical advice about side effects. You may report side effects to FDA at 1-800-FDA-1088. Where should I keep my medicine? This does not apply. NOTE: This sheet is a summary. It may not cover all possible information. If you have questions about this medicine, talk to your doctor, pharmacist, or health care provider.  2018 Elsevier/Gold Standard (2016-07-23 14:14:56)   

## 2018-04-14 ENCOUNTER — Other Ambulatory Visit: Payer: Self-pay | Admitting: Nurse Practitioner

## 2018-05-03 ENCOUNTER — Other Ambulatory Visit: Payer: Medicaid Other | Admitting: Women's Health

## 2018-06-12 ENCOUNTER — Telehealth: Payer: Self-pay | Admitting: Family Medicine

## 2018-06-12 ENCOUNTER — Encounter: Payer: Self-pay | Admitting: Family Medicine

## 2018-06-12 MED ORDER — ALPRAZOLAM 1 MG PO TABS: ORAL_TABLET | ORAL | 0 refills | Status: DC

## 2018-06-12 MED ORDER — CYCLOBENZAPRINE HCL 10 MG PO TABS: ORAL_TABLET | ORAL | 0 refills | Status: DC

## 2018-06-12 NOTE — Telephone Encounter (Signed)
Prescription faxed to pharmacy. Patient notified. 

## 2018-06-12 NOTE — Telephone Encounter (Signed)
Pt calling and checking the status of refill on the ALPRAZolam (XANAX) 1 MG tablet. The pharmacy has received the other medication she requested today.

## 2018-06-29 ENCOUNTER — Other Ambulatory Visit: Payer: Medicaid Other | Admitting: Women's Health

## 2018-07-11 ENCOUNTER — Other Ambulatory Visit: Payer: Self-pay | Admitting: Family Medicine

## 2018-07-11 ENCOUNTER — Encounter: Payer: Self-pay | Admitting: Family Medicine

## 2018-07-12 MED ORDER — CYCLOBENZAPRINE HCL 10 MG PO TABS
ORAL_TABLET | ORAL | 0 refills | Status: DC
Start: 2018-07-12 — End: 2018-07-12

## 2018-07-12 MED ORDER — ALPRAZOLAM 1 MG PO TABS: ORAL_TABLET | ORAL | 0 refills | Status: DC

## 2018-07-12 MED ORDER — CYCLOBENZAPRINE HCL 10 MG PO TABS: ORAL_TABLET | ORAL | 0 refills | Status: DC

## 2018-07-12 NOTE — Telephone Encounter (Signed)
I though t we already did these yes times one, I think pt contacted us via mychart

## 2018-07-13 NOTE — Telephone Encounter (Signed)
Responded to elsewhere

## 2018-07-14 ENCOUNTER — Ambulatory Visit: Payer: Medicaid Other | Admitting: Family Medicine

## 2018-08-09 ENCOUNTER — Other Ambulatory Visit: Payer: Self-pay | Admitting: Advanced Practice Midwife

## 2018-08-09 ENCOUNTER — Other Ambulatory Visit: Payer: Self-pay | Admitting: Family Medicine

## 2018-08-09 ENCOUNTER — Encounter: Payer: Self-pay | Admitting: Family Medicine

## 2018-08-09 ENCOUNTER — Ambulatory Visit: Payer: Medicaid Other | Admitting: Family Medicine

## 2018-08-09 VITALS — Ht 67.0 in | Wt 283.6 lb

## 2018-08-09 DIAGNOSIS — G4700 Insomnia, unspecified: Secondary | ICD-10-CM

## 2018-08-09 DIAGNOSIS — Z23 Encounter for immunization: Secondary | ICD-10-CM | POA: Diagnosis not present

## 2018-08-09 DIAGNOSIS — F418 Other specified anxiety disorders: Secondary | ICD-10-CM | POA: Diagnosis not present

## 2018-08-09 MED ORDER — DULOXETINE HCL 60 MG PO CPEP: 60.0000 mg | ORAL_CAPSULE | Freq: Every day | ORAL | 1 refills | Status: DC

## 2018-08-09 MED ORDER — BUPROPION HCL ER (XL) 150 MG PO TB24: 150.0000 mg | ORAL_TABLET | Freq: Every day | ORAL | 1 refills | Status: DC

## 2018-08-09 MED ORDER — BUPROPION HCL ER (XL) 300 MG PO TB24
300.0000 mg | ORAL_TABLET | Freq: Every day | ORAL | 1 refills | Status: DC
Start: 2018-08-09 — End: 2019-03-14

## 2018-08-09 MED ORDER — PHENTERMINE HCL 37.5 MG PO CAPS: ORAL_CAPSULE | ORAL | 2 refills | Status: DC

## 2018-08-09 MED ORDER — ALPRAZOLAM 1 MG PO TABS: ORAL_TABLET | ORAL | 5 refills | Status: DC

## 2018-08-09 NOTE — Progress Notes (Signed)
   Subjective:    Patient ID: Christie Morrow, female    DOB: 01/02/1981, 37 y.o.   MRN: [id number]  Depression         This is a chronic problem. Pt would like refill on all medications.  Pt notes phentermine defiiniely helped before, would like to restat again, in past lost quite a fewpounds.  Has used the past.  Definitely helps.  Would like to use to get started  Patient notes ongoing compliance with antidepressant medication. No obvious side effects. Reports does not miss a dose. Overall continues to help depression substantially. No thoughts of homicide or suicide. Would like to maintain medication.  Patient compliant with insomnia medication. Generally takes most nights. No obvious morning drowsiness. Definitely helps patient sleep. Without it patient states would not get a good nights rest.   Tired often     Review of Systems  Psychiatric/Behavioral: Positive for depression.       Objective:   Physical Exam  Alert and oriented, vitals reviewed and stable, NAD ENT-TM's and ext canals WNL bilat via otoscopic exam Soft palate, tonsils and post pharynx WNL via oropharyngeal exam Neck-symmetric, no masses; thyroid nonpalpable and nontender Pulmonary-no tachypnea or accessory muscle use; Clear without wheezes via auscultation Card--no abnrml murmurs, rhythm reg and rate WNL Carotid pulses symmetric, without bruits BMI 44     Assessment & Plan:  Impression 1 morbid obesity.  3 months of phentermine.  Proper use discussed side effects benefits discussed  2.  Depression.  Ongoing.  No suicidal or homicidal thoughts.  Medications refilled  3.  Insomnia ongoing meds refilled diet exercise discussed.  Follow-up in 6 months

## 2018-08-10 MED ORDER — CYCLOBENZAPRINE HCL 10 MG PO TABS: ORAL_TABLET | ORAL | 0 refills | Status: DC

## 2018-09-07 ENCOUNTER — Other Ambulatory Visit: Payer: Medicaid Other | Admitting: Women's Health

## 2018-09-08 ENCOUNTER — Other Ambulatory Visit: Payer: Self-pay | Admitting: Family Medicine

## 2018-09-08 ENCOUNTER — Encounter: Payer: Self-pay | Admitting: *Deleted

## 2018-09-08 ENCOUNTER — Other Ambulatory Visit: Payer: Self-pay

## 2018-09-08 ENCOUNTER — Other Ambulatory Visit: Payer: Self-pay | Admitting: Advanced Practice Midwife

## 2018-09-08 MED ORDER — CYCLOBENZAPRINE HCL 10 MG PO TABS: ORAL_TABLET | ORAL | 0 refills | Status: DC

## 2018-10-04 ENCOUNTER — Other Ambulatory Visit: Payer: Self-pay | Admitting: Women's Health

## 2018-10-04 MED ORDER — VALACYCLOVIR HCL 500 MG PO TABS: 500.0000 mg | ORAL_TABLET | Freq: Two times a day (BID) | ORAL | 6 refills | Status: DC

## 2018-10-04 MED ORDER — OMEPRAZOLE 20 MG PO CPDR: 20.0000 mg | DELAYED_RELEASE_CAPSULE | Freq: Every day | ORAL | 6 refills | Status: DC

## 2018-10-04 MED ORDER — HYDROCHLOROTHIAZIDE 25 MG PO TABS: 25.0000 mg | ORAL_TABLET | Freq: Every day | ORAL | 3 refills | Status: DC

## 2018-10-05 ENCOUNTER — Other Ambulatory Visit: Payer: Self-pay | Admitting: Family Medicine

## 2018-10-05 MED ORDER — CYCLOBENZAPRINE HCL 10 MG PO TABS: ORAL_TABLET | ORAL | 0 refills | Status: DC

## 2018-10-13 ENCOUNTER — Other Ambulatory Visit: Payer: Medicaid Other | Admitting: Adult Health

## 2018-11-03 ENCOUNTER — Other Ambulatory Visit: Payer: Self-pay | Admitting: Family Medicine

## 2018-11-03 MED ORDER — CYCLOBENZAPRINE HCL 10 MG PO TABS: ORAL_TABLET | ORAL | 0 refills | Status: DC

## 2018-11-30 ENCOUNTER — Other Ambulatory Visit: Payer: Self-pay | Admitting: Family Medicine

## 2018-11-30 MED ORDER — CYCLOBENZAPRINE HCL 10 MG PO TABS: ORAL_TABLET | ORAL | 0 refills | Status: DC

## 2018-12-05 ENCOUNTER — Other Ambulatory Visit: Payer: Self-pay | Admitting: Family Medicine

## 2018-12-05 ENCOUNTER — Encounter: Payer: Self-pay | Admitting: Family Medicine

## 2018-12-06 ENCOUNTER — Other Ambulatory Visit: Payer: Self-pay

## 2018-12-06 MED ORDER — CYCLOBENZAPRINE HCL 10 MG PO TABS: ORAL_TABLET | ORAL | 5 refills | Status: DC

## 2018-12-06 NOTE — Telephone Encounter (Signed)
Already done via phone mess

## 2018-12-13 ENCOUNTER — Other Ambulatory Visit: Payer: Self-pay | Admitting: Women's Health

## 2018-12-13 MED ORDER — SULFAMETHOXAZOLE-TRIMETHOPRIM 800-160 MG PO TABS: 1.0000 | ORAL_TABLET | Freq: Two times a day (BID) | ORAL | 0 refills | Status: DC

## 2019-01-31 ENCOUNTER — Other Ambulatory Visit: Payer: Self-pay | Admitting: Family Medicine

## 2019-01-31 ENCOUNTER — Encounter: Payer: Self-pay | Admitting: Family Medicine

## 2019-01-31 NOTE — Telephone Encounter (Signed)
Virtual visit at that hi dose and 6 mo

## 2019-03-05 ENCOUNTER — Ambulatory Visit: Payer: Medicaid Other | Admitting: Obstetrics & Gynecology

## 2019-03-08 ENCOUNTER — Encounter: Payer: Self-pay | Admitting: *Deleted

## 2019-03-09 ENCOUNTER — Ambulatory Visit: Payer: Medicaid Other | Admitting: Obstetrics & Gynecology

## 2019-03-09 ENCOUNTER — Other Ambulatory Visit: Payer: Self-pay

## 2019-03-09 ENCOUNTER — Encounter: Payer: Self-pay | Admitting: Obstetrics & Gynecology

## 2019-03-09 VITALS — BP 135/79 | HR 104

## 2019-03-09 DIAGNOSIS — N814 Uterovaginal prolapse, unspecified: Secondary | ICD-10-CM | POA: Diagnosis not present

## 2019-03-09 MED ORDER — HYDROCHLOROTHIAZIDE 25 MG PO TABS: 25.0000 mg | ORAL_TABLET | Freq: Every day | ORAL | 3 refills | Status: DC

## 2019-03-09 NOTE — Progress Notes (Signed)
Chief Complaint  Patient presents with  . cervix coming out      38 y.o. [id number] No LMP recorded. The current method of family planning is none.  Outpatient Encounter Medications as of 03/09/2019  Medication Sig  . ALPRAZolam (XANAX) 1 MG tablet TAKE 1/2 (ONE-HALF) TABLET BY MOUTH TWICE DAILY AS NEEDED FOR ANXIETY  . hydrochlorothiazide (HYDRODIURIL) 25 MG tablet Take 1 tablet (25 mg total) by mouth daily.  Marland Kitchen omeprazole (PRILOSEC) 20 MG capsule Take 1 capsule (20 mg total) by mouth daily.  . valACYclovir (VALTREX) 500 MG tablet Take 1 tablet (500 mg total) by mouth 2 (two) times daily.  . [DISCONTINUED] buPROPion (WELLBUTRIN XL) 150 MG 24 hr tablet Take 1 tablet (150 mg total) by mouth daily.  . [DISCONTINUED] buPROPion (WELLBUTRIN XL) 300 MG 24 hr tablet Take 1 tablet (300 mg total) by mouth daily.  . [DISCONTINUED] cyclobenzaprine (FLEXERIL) 10 MG tablet TAKE 1 TABLET EVERY 8 HOURS AS NEEDED FOR MUSCLE SPASMS.  . [DISCONTINUED] cyclobenzaprine (FLEXERIL) 10 MG tablet Take one tab po qhs prn muscle spasms  . [DISCONTINUED] DULoxetine (CYMBALTA) 60 MG capsule Take 1 capsule (60 mg total) by mouth daily.  . [DISCONTINUED] hydrochlorothiazide (HYDRODIURIL) 25 MG tablet Take 1 tablet (25 mg total) by mouth daily.  . [DISCONTINUED] aspirin 81 MG chewable tablet Chew 81 mg by mouth daily.  . [DISCONTINUED] cefPROZIL (CEFZIL) 500 MG tablet Take 1 tablet (500 mg total) by mouth 2 (two) times daily. (Patient not taking: Reported on 08/09/2018)  . [DISCONTINUED] diphenhydramine-acetaminophen (TYLENOL PM) 25-500 MG TABS tablet Take 2 tablets by mouth at bedtime as needed (for sleep/pain).  . [DISCONTINUED] ferrous sulfate 325 (65 FE) MG tablet Take 1 tablet (325 mg total) by mouth 2 (two) times daily with a meal. (Patient not taking: Reported on 03/09/2019)  . [DISCONTINUED] ibuprofen (ADVIL,MOTRIN) 600 MG tablet Take 1 tablet (600 mg total) by mouth every 6 (six) hours. (Patient not taking:  Reported on 03/09/2019)  . [DISCONTINUED] phentermine 37.5 MG capsule Take one capsule by mouth daily (Patient not taking: Reported on 03/09/2019)  . [DISCONTINUED] Prenatal Vit-Fe Fumarate-FA (MULTIVITAMIN-PRENATAL) 27-0.8 MG TABS tablet Take 1 tablet by mouth daily at 12 noon.  . [DISCONTINUED] sulfamethoxazole-trimethoprim (BACTRIM DS,SEPTRA DS) 800-160 MG tablet Take 1 tablet by mouth 2 (two) times daily. X 7 days (Patient not taking: Reported on 03/09/2019)   No facility-administered encounter medications on file as of 03/09/2019.     Subjective Pt has noticed pressure discomfort pulling and occasionally a mass at the vaginal opening over the past weeks to months Past Medical History:  Diagnosis Date  . Arthritis   . Disc disorder   . Fibromyalgia   . Mental disorder    depression/anxiety  . Vaginal Pap smear, abnormal     Past Surgical History:  Procedure Laterality Date  . CRYOTHERAPY    . FLEXIBLE SIGMOIDOSCOPY  06/11/2011   Procedure: FLEXIBLE SIGMOIDOSCOPY;  Surgeon: Malissa Hippo, MD;  Location: [facility];  Service: Endoscopy;  Laterality: N/A;  3:30  . WISDOM TOOTH EXTRACTION  2003    OB History    Gravida  3   Para  2   Term  1   Preterm  1   AB  1   Living  2     SAB  1   TAB      Ectopic      Multiple  0   Live Births  2  No Known Allergies  Social History   Socioeconomic History  . Marital status: Married    Spouse name: Not on file  . Number of children: Not on file  . Years of education: Not on file  . Highest education level: Not on file  Occupational History  . Not on file  Social Needs  . Financial resource strain: Not on file  . Food insecurity    Worry: Not on file    Inability: Not on file  . Transportation needs    Medical: Not on file    Non-medical: Not on file  Tobacco Use  . Smoking status: Never Smoker  . Smokeless tobacco: Never Used  Substance and Sexual Activity  . Alcohol use: No  . Drug use:  No  . Sexual activity: Yes    Birth control/protection: I.U.D.  Lifestyle  . Physical activity    Days per week: Not on file    Minutes per session: Not on file  . Stress: Not on file  Relationships  . Social Musicianconnections    Talks on phone: Not on file    Gets together: Not on file    Attends religious service: Not on file    Active member of club or organization: Not on file    Attends meetings of clubs or organizations: Not on file    Relationship status: Not on file  Other Topics Concern  . Not on file  Social History Narrative  . Not on file    Family History  Problem Relation Age of Onset  . Heart disease Father   . Multiple sclerosis Father   . Stroke Father   . Cancer Maternal Grandmother        breast  . Heart disease Maternal Grandmother   . Heart disease Maternal Grandfather   . Heart disease Paternal Grandmother   . Heart disease Paternal Grandfather     Medications:       Current Outpatient Medications:  .  ALPRAZolam (XANAX) 1 MG tablet, TAKE 1/2 (ONE-HALF) TABLET BY MOUTH TWICE DAILY AS NEEDED FOR ANXIETY, Disp: 120 tablet, Rfl: 5 .  hydrochlorothiazide (HYDRODIURIL) 25 MG tablet, Take 1 tablet (25 mg total) by mouth daily., Disp: 30 tablet, Rfl: 3 .  omeprazole (PRILOSEC) 20 MG capsule, Take 1 capsule (20 mg total) by mouth daily., Disp: 30 capsule, Rfl: 6 .  valACYclovir (VALTREX) 500 MG tablet, Take 1 tablet (500 mg total) by mouth 2 (two) times daily., Disp: 60 tablet, Rfl: 6 .  buPROPion (WELLBUTRIN XL) 150 MG 24 hr tablet, Take 1 tablet (150 mg total) by mouth daily., Disp: 90 tablet, Rfl: 1 .  buPROPion (WELLBUTRIN XL) 300 MG 24 hr tablet, Take 1 tablet (300 mg total) by mouth daily., Disp: 90 tablet, Rfl: 0 .  cyclobenzaprine (FLEXERIL) 10 MG tablet, TAKE 1 TABLET EVERY 8 HOURS AS NEEDED FOR MUSCLE SPASMS., Disp: 30 tablet, Rfl: 5 .  DULoxetine (CYMBALTA) 60 MG capsule, Take 1 capsule (60 mg total) by mouth daily., Disp: 90 capsule, Rfl: 1  Objective  Blood pressure 135/79, pulse (!) 104, not currently breastfeeding.  General [facility] female NAD Vulva:  normal appearing vulva with no masses, tenderness or lesions Vagina:  normal mucosa, no discharge Cervix:  Grade 2-3 uterine/cervical prolapse Uterus:  normal size, contour, position, consistency, mobility, non-tender Adnexa: ovaries:present,  normal adnexa in size, nontender and no masses   Pertinent ROS No burning with urination, frequency or urgency No nausea, vomiting or diarrhea Nor fever chills  or other constitutional symptoms   Labs or studies     Impression Diagnoses this Encounter::   ICD-10-CM   1. Uterine prolapse  N81.4     Established relevant diagnosis(es):   Plan/Recommendations: Meds ordered this encounter  Medications  . hydrochlorothiazide (HYDRODIURIL) 25 MG tablet    Sig: Take 1 tablet (25 mg total) by mouth daily.    Dispense:  30 tablet    Refill:  3    Labs or Scans Ordered: No orders of the defined types were placed in this encounter.   Management:: >pt aware in the future will need surgical correction, discussed delaying and doing Kegels to ward off, due to liklihood of recurrence  Follow up No follow-ups on file.     {47829: 10 min     99213: 15 min      99214:25   All questions were answered.

## 2019-03-13 ENCOUNTER — Other Ambulatory Visit: Payer: Self-pay | Admitting: Family Medicine

## 2019-03-13 NOTE — Telephone Encounter (Signed)
sched visit can do virtual, may then ref times one

## 2019-03-14 NOTE — Telephone Encounter (Signed)
Left message to return call 

## 2019-03-14 NOTE — Telephone Encounter (Signed)
Pt returned call and has med check scheduled for 5/22

## 2019-03-16 ENCOUNTER — Ambulatory Visit (INDEPENDENT_AMBULATORY_CARE_PROVIDER_SITE_OTHER): Payer: Medicaid Other | Admitting: Family Medicine

## 2019-03-16 ENCOUNTER — Other Ambulatory Visit: Payer: Self-pay

## 2019-03-16 DIAGNOSIS — F418 Other specified anxiety disorders: Secondary | ICD-10-CM

## 2019-03-16 MED ORDER — BUPROPION HCL ER (XL) 150 MG PO TB24: 150.0000 mg | ORAL_TABLET | Freq: Every day | ORAL | 1 refills | Status: DC

## 2019-03-16 MED ORDER — DULOXETINE HCL 60 MG PO CPEP: 60.0000 mg | ORAL_CAPSULE | Freq: Every day | ORAL | 1 refills | Status: DC

## 2019-03-16 MED ORDER — BUPROPION HCL ER (XL) 300 MG PO TB24: 300.0000 mg | ORAL_TABLET | Freq: Every day | ORAL | 1 refills | Status: DC

## 2019-03-16 NOTE — Progress Notes (Signed)
   Subjective:    Patient ID: Christie Morrow, female    DOB: 1981-07-23, 38 y.o.   MRN: [id number]  HPI  Patient calls for a follow up on depression and anxiety. Patient states she is having problems with anger fits and quick tempered. Patient states a little thing can make her snap and she either starts crying or gets super angry. Patient states she is having mood swings like a roller coaster she cant control. Patient wonders if her meds are not helping anymore. Patient says she also wonders if she is bipolar or something.  Virtual Visit via Video Note  I connected with Christie Morrow on 03/16/19 at  2:30 PM EDT by a video enabled telemedicine application and verified that I am speaking with the correct person using two identifiers.  Location: Patient: home Provider: office   I discussed the limitations of evaluation and management by telemedicine and the availability of in person appointments. The patient expressed understanding and agreed to proceed.  History of Present Illness:    Observations/Objective:   Assessment and Plan:   Follow Up Instructions:    I discussed the assessment and treatment plan with the patient. The patient was provided an opportunity to ask questions and all were answered. The patient agreed with the plan and demonstrated an understanding of the instructions.   The patient was advised to call back or seek an in-person evaluation if the symptoms worsen or if the condition fails to improve as anticipated.  I provided 25 minutes of non-face-to-face time during this encounter.   Patient states her chronic mood swings are getting worse.  She wonders if she has bipolar.  She has been through numerous medications in the past.  Now on substantial dose of Cymbalta and Wellbutrin.  Feels not really controlling her symptoms.  Not suicidal.  Not homicidal.  Very needy however.  Also prone to anger.  Also prone to substantial mood swings   Review of Systems No  headache, no major weight loss or weight gain, no chest pain no back pain abdominal pain no change in bowel habits complete ROS otherwise negative     Objective:   Physical Exam  Virtual visit      Assessment & Plan:  Impression chronic depression and anxiety with comorbidities and insomnia and anger issues and failure on multitudes of meds and strong family history it may need to have an element of bipolar.  Options discussed.  Will refill medications for now but work on a psychiatry referral in [place] I think the patient would benefit

## 2019-03-28 ENCOUNTER — Encounter: Payer: Self-pay | Admitting: Family Medicine

## 2019-04-02 ENCOUNTER — Other Ambulatory Visit: Payer: Self-pay | Admitting: Advanced Practice Midwife

## 2019-04-03 ENCOUNTER — Encounter: Payer: Self-pay | Admitting: Family Medicine

## 2019-04-03 ENCOUNTER — Other Ambulatory Visit: Payer: Self-pay | Admitting: Advanced Practice Midwife

## 2019-04-04 MED ORDER — CYCLOBENZAPRINE HCL 10 MG PO TABS: ORAL_TABLET | ORAL | 5 refills | Status: DC

## 2019-04-17 ENCOUNTER — Encounter: Payer: Self-pay | Admitting: Family Medicine

## 2019-04-17 MED ORDER — BUPROPION HCL ER (XL) 300 MG PO TB24: 300.0000 mg | ORAL_TABLET | Freq: Every day | ORAL | 0 refills | Status: DC

## 2019-05-02 ENCOUNTER — Other Ambulatory Visit: Payer: Self-pay

## 2019-05-02 ENCOUNTER — Ambulatory Visit (INDEPENDENT_AMBULATORY_CARE_PROVIDER_SITE_OTHER): Payer: Medicaid Other | Admitting: Family Medicine

## 2019-05-02 DIAGNOSIS — M7542 Impingement syndrome of left shoulder: Secondary | ICD-10-CM

## 2019-05-02 MED ORDER — PREDNISONE 20 MG PO TABS: ORAL_TABLET | ORAL | 0 refills | Status: DC

## 2019-05-02 NOTE — Progress Notes (Signed)
   Subjective:  Audio plus video  Patient ID: Christie Morrow, female    DOB: 07-16-1981, 38 y.o.   MRN: [id number]  HPI  Patient calls with left shoulder pain for 5 months. Patient feels like it started from lifting daughters carrier into her truck.  Virtual Visit via Video Note  I connected with Christie Morrow on 05/02/19 at 11:30 AM EDT by a video enabled telemedicine application and verified that I am speaking with the correct person using two identifiers.  Location: Patient: home Provider: office   I discussed the limitations of evaluation and management by telemedicine and the availability of in person appointments. The patient expressed understanding and agreed to proceed.  History of Present Illness:    Observations/Objective:   Assessment and Plan:   Follow Up Instructions:    I discussed the assessment and treatment plan with the patient. The patient was provided an opportunity to ask questions and all were answered. The patient agreed with the plan and demonstrated an understanding of the instructions.   The patient was advised to call back or seek an in-person evaluation if the symptoms worsen or if the condition fails to improve as anticipated.  I provided 80minutes of non-face-to-face time during this encounter.  Progressive shoulder pain.  First occurred when lifting heavy child.  Now having difficulty extending her arm.  History rotator cuff injury in the other shoulder in the past.     Review of Systems No headache, no major weight loss or weight gain, no chest pain no back pain abdominal pain no change in bowel habits complete ROS otherwise negative     Objective:   Physical Exam  Virtual      Assessment & Plan:  Impression shoulder strain potentially impingement syndrome.  May be even with elements of frozen shoulder.  Codman's exercises discussed.  Steroid taper prescribed.  If persists plan for steroid shot

## 2019-05-03 ENCOUNTER — Encounter: Payer: Self-pay | Admitting: Family Medicine

## 2019-05-18 ENCOUNTER — Encounter: Payer: Self-pay | Admitting: Family Medicine

## 2019-06-01 ENCOUNTER — Other Ambulatory Visit: Payer: Self-pay | Admitting: Women's Health

## 2019-07-17 ENCOUNTER — Other Ambulatory Visit: Payer: Self-pay | Admitting: Women's Health

## 2019-07-17 ENCOUNTER — Other Ambulatory Visit: Payer: Self-pay | Admitting: Obstetrics & Gynecology

## 2019-07-20 ENCOUNTER — Other Ambulatory Visit: Payer: Self-pay | Admitting: Nurse Practitioner

## 2019-07-20 ENCOUNTER — Encounter: Payer: Self-pay | Admitting: Family Medicine

## 2019-07-23 ENCOUNTER — Ambulatory Visit (HOSPITAL_COMMUNITY): Payer: Medicaid Other | Admitting: Psychiatry

## 2019-07-23 ENCOUNTER — Other Ambulatory Visit: Payer: Self-pay

## 2019-07-23 ENCOUNTER — Telehealth (HOSPITAL_COMMUNITY): Payer: Self-pay | Admitting: Psychiatry

## 2019-07-23 NOTE — Telephone Encounter (Signed)
Sent link for video visit through D[facility] me. Patient did not sign in. Called the patient  twice for appointment scheduled today. The patient did not answer the phone. Left voice message to contact the office.  

## 2019-08-07 ENCOUNTER — Other Ambulatory Visit: Payer: Self-pay | Admitting: Women's Health

## 2019-08-07 ENCOUNTER — Telehealth: Payer: Self-pay | Admitting: *Deleted

## 2019-08-07 MED ORDER — NITROFURANTOIN MONOHYD MACRO 100 MG PO CAPS: 100.0000 mg | ORAL_CAPSULE | Freq: Two times a day (BID) | ORAL | 0 refills | Status: DC

## 2019-08-07 NOTE — Telephone Encounter (Signed)
Pt left message that she would like [place] to send in an antibiotic for a uti.

## 2019-08-28 ENCOUNTER — Telehealth: Payer: Self-pay | Admitting: Family Medicine

## 2019-08-29 NOTE — Telephone Encounter (Signed)
Please call pt and schedule. thanks

## 2019-08-29 NOTE — Telephone Encounter (Signed)
Patient needs follow up appointment. Last seen in May for this issue. Thanks.

## 2019-08-29 NOTE — Telephone Encounter (Signed)
Patient scheduled a phone visit with Hoyle Sauer for this coming Friday. Refills can wait until then.

## 2019-08-31 ENCOUNTER — Ambulatory Visit (INDEPENDENT_AMBULATORY_CARE_PROVIDER_SITE_OTHER): Payer: Medicaid Other | Admitting: Nurse Practitioner

## 2019-08-31 DIAGNOSIS — R5383 Other fatigue: Secondary | ICD-10-CM | POA: Diagnosis not present

## 2019-08-31 DIAGNOSIS — F418 Other specified anxiety disorders: Secondary | ICD-10-CM | POA: Diagnosis not present

## 2019-08-31 NOTE — Progress Notes (Signed)
VIDEO VISIT  Subjective:    Patient ID: Christie Morrow, female    DOB: 1981/09/08, 38 y.o.   MRN: [id number]  HPI med check up on anxiety and depression. phq9 and gad 7 done.   Virtual Visit via Video Note  I connected with Christie Morrow on 08/31/19 at  2:20 PM EST by a video enabled telemedicine application and verified that I am speaking with the correct person using two identifiers.  Location: Patient: home Provider: office   I discussed the limitations of evaluation and management by telemedicine and the availability of in person appointments. The patient expressed understanding and agreed to proceed.  History of Present Illness: GAD 7 : Generalized Anxiety Score 08/31/2019 08/09/2018 08/19/2017  Nervous, Anxious, on Edge 0 2 0  Control/stop worrying 1 1 0  Worry too much - different things 1 1 1   Trouble relaxing 0 0 0  Restless 0 0 0  Easily annoyed or irritable 1 2 1   Afraid - awful might happen 1 1 0  Total GAD 7 Score 4 7 2   Anxiety Difficulty Not difficult at all Somewhat difficult -    Depression screen [facility] 2/9 08/31/2019 08/09/2018 08/08/2017  Decreased Interest 0 2 1  Down, Depressed, Hopeless 0 1 0  PHQ - 2 Score 0 3 1  Altered sleeping 0 1 0  Tired, decreased energy 1 3 1   Change in appetite 0 1 0  Feeling bad or failure about yourself  0 0 0  Trouble concentrating 0 0 0  Moving slowly or fidgety/restless 0 0 0  Suicidal thoughts 0 0 0  PHQ-9 Score 1 8 2   Difficult doing work/chores Not difficult at all Somewhat difficult Not difficult at all   Presents for recheck. Doing well on current regimen. Has IUD for birth control. Has noticed bulging at times at the opening to her vagina where she can feel the IUD string. Gets physicals with Dr. Elonda Husky. Plans to get flu vaccine. Would like to restart Adipex to help jumpstart her weight loss. Has taken this without difficulty in the past with her current meds at this dose. Doing better with diet. Activity: mainly  walking. Her prepregnancy weight was 274 lbs. Current weight 272 lbs. Experiencing some fatigue.    Observations/Objective: Format  Patient present at home Provider present at office Consent for interaction obtained Coronavirus outbreak made virtual visit necessary  Alert, oriented. Cheerful, calm affect. Thoughts logical, coherent and relevant.   Assessment and Plan: Problem List Items Addressed This Visit      Other   Depression with anxiety - Primary   Morbid obesity ([place])   Relevant Medications   phentermine (ADIPEX-P) 37.5 MG tablet    Other Visit Diagnoses    Fatigue, unspecified type       Relevant Orders   CBC with Diff   TSH   Vitamin D 25 hydroxy     Meds ordered this encounter  Medications   phentermine (ADIPEX-P) 37.5 MG tablet    Sig: Take 1 tablet (37.5 mg total) by mouth daily before breakfast.    Dispense:  30 tablet    Refill:  2    Order Specific Question:   Supervising Provider    Answer:   Sallee Lange A [[id number]]   Follow Up Instructions: Reviewed potential side effects of Phentermine. DC med and contact office if any problems.  Increase activity such as walking. Continue to work on diet. Patient understands this is a short term measure. Plans  to use this to jump start weight loss.  Labs pending. Encouraged patient to get flu vaccine. Return in about 3 months (around 12/01/2019) for recheck.    I discussed the assessment and treatment plan with the patient. The patient was provided an opportunity to ask questions and all were answered. The patient agreed with the plan and demonstrated an understanding of the instructions.   The patient was advised to call back or seek an in-person evaluation if the symptoms worsen or if the condition fails to improve as anticipated.  I provided 15 minutes of non-face-to-face time during this encounter.        Review of Systems     Objective:   Physical Exam        Assessment & Plan:

## 2019-09-01 ENCOUNTER — Encounter: Payer: Self-pay | Admitting: Nurse Practitioner

## 2019-09-01 MED ORDER — PHENTERMINE HCL 37.5 MG PO TABS: 37.5000 mg | ORAL_TABLET | Freq: Every day | ORAL | 2 refills | Status: DC

## 2019-09-17 ENCOUNTER — Other Ambulatory Visit: Payer: Self-pay | Admitting: Family Medicine

## 2019-09-27 ENCOUNTER — Other Ambulatory Visit (INDEPENDENT_AMBULATORY_CARE_PROVIDER_SITE_OTHER): Payer: Medicaid Other | Admitting: *Deleted

## 2019-09-27 ENCOUNTER — Other Ambulatory Visit: Payer: Self-pay

## 2019-09-27 DIAGNOSIS — Z23 Encounter for immunization: Secondary | ICD-10-CM | POA: Diagnosis not present

## 2019-09-28 ENCOUNTER — Other Ambulatory Visit: Payer: Medicaid Other

## 2019-09-30 ENCOUNTER — Other Ambulatory Visit: Payer: Self-pay | Admitting: Nurse Practitioner

## 2019-10-01 NOTE — Telephone Encounter (Signed)
Six mo worth 

## 2019-10-30 ENCOUNTER — Other Ambulatory Visit: Payer: Self-pay | Admitting: Family Medicine

## 2019-10-30 MED ORDER — CYCLOBENZAPRINE HCL 10 MG PO TABS: ORAL_TABLET | ORAL | 0 refills | Status: DC

## 2019-10-30 NOTE — Telephone Encounter (Signed)
Med check 08/31/19

## 2019-10-30 NOTE — Telephone Encounter (Signed)
Med check 08/31/19 

## 2019-10-31 MED ORDER — CYCLOBENZAPRINE HCL 10 MG PO TABS: ORAL_TABLET | ORAL | 0 refills | Status: DC

## 2019-11-05 ENCOUNTER — Other Ambulatory Visit: Payer: Self-pay | Admitting: Women's Health

## 2019-11-26 ENCOUNTER — Encounter: Payer: Self-pay | Admitting: Family Medicine

## 2019-11-26 ENCOUNTER — Other Ambulatory Visit: Payer: Self-pay | Admitting: Family Medicine

## 2019-11-27 ENCOUNTER — Other Ambulatory Visit: Payer: Self-pay | Admitting: Nurse Practitioner

## 2019-11-29 MED ORDER — CYCLOBENZAPRINE HCL 10 MG PO TABS: ORAL_TABLET | ORAL | 0 refills | Status: DC

## 2019-12-06 ENCOUNTER — Other Ambulatory Visit: Payer: Self-pay | Admitting: Women's Health

## 2019-12-24 ENCOUNTER — Other Ambulatory Visit: Payer: Self-pay | Admitting: Nurse Practitioner

## 2019-12-24 MED ORDER — CYCLOBENZAPRINE HCL 10 MG PO TABS: ORAL_TABLET | ORAL | 0 refills | Status: DC

## 2020-01-16 ENCOUNTER — Other Ambulatory Visit: Payer: Self-pay | Admitting: Adult Health

## 2020-01-16 ENCOUNTER — Other Ambulatory Visit: Payer: Self-pay | Admitting: Nurse Practitioner

## 2020-01-16 ENCOUNTER — Other Ambulatory Visit: Payer: Self-pay

## 2020-01-17 ENCOUNTER — Encounter: Payer: Self-pay | Admitting: Nurse Practitioner

## 2020-01-21 ENCOUNTER — Other Ambulatory Visit: Payer: Self-pay | Admitting: Family Medicine

## 2020-01-22 MED ORDER — CYCLOBENZAPRINE HCL 10 MG PO TABS: ORAL_TABLET | ORAL | 0 refills | Status: DC

## 2020-02-01 ENCOUNTER — Encounter: Payer: Self-pay | Admitting: Nurse Practitioner

## 2020-02-01 ENCOUNTER — Ambulatory Visit: Payer: Medicaid Other | Admitting: Nurse Practitioner

## 2020-02-01 ENCOUNTER — Other Ambulatory Visit: Payer: Self-pay

## 2020-02-06 DIAGNOSIS — Z23 Encounter for immunization: Secondary | ICD-10-CM | POA: Diagnosis not present

## 2020-03-04 ENCOUNTER — Other Ambulatory Visit: Payer: Self-pay | Admitting: Adult Health

## 2020-03-05 ENCOUNTER — Other Ambulatory Visit: Payer: Self-pay | Admitting: Nurse Practitioner

## 2020-03-05 DIAGNOSIS — M25521 Pain in right elbow: Secondary | ICD-10-CM | POA: Diagnosis not present

## 2020-03-05 DIAGNOSIS — M7711 Lateral epicondylitis, right elbow: Secondary | ICD-10-CM | POA: Diagnosis not present

## 2020-03-05 DIAGNOSIS — Z23 Encounter for immunization: Secondary | ICD-10-CM | POA: Diagnosis not present

## 2020-03-05 DIAGNOSIS — M7701 Medial epicondylitis, right elbow: Secondary | ICD-10-CM | POA: Diagnosis not present

## 2020-03-06 MED ORDER — CYCLOBENZAPRINE HCL 10 MG PO TABS: ORAL_TABLET | ORAL | 0 refills | Status: DC

## 2020-03-25 ENCOUNTER — Other Ambulatory Visit: Payer: Self-pay | Admitting: Family Medicine

## 2020-03-25 MED ORDER — BUPROPION HCL ER (XL) 300 MG PO TB24: 300.0000 mg | ORAL_TABLET | Freq: Every day | ORAL | 0 refills | Status: DC

## 2020-03-25 MED ORDER — DULOXETINE HCL 60 MG PO CPEP: 60.0000 mg | ORAL_CAPSULE | Freq: Every day | ORAL | 0 refills | Status: DC

## 2020-03-25 NOTE — Telephone Encounter (Signed)
Please contact patient to have her set up appt. Then may route back to nurses. Thank you 

## 2020-03-25 NOTE — Telephone Encounter (Signed)
Scheduled 6/11

## 2020-03-26 ENCOUNTER — Telehealth: Payer: Self-pay | Admitting: Family Medicine

## 2020-03-26 DIAGNOSIS — J069 Acute upper respiratory infection, unspecified: Secondary | ICD-10-CM | POA: Diagnosis not present

## 2020-03-26 DIAGNOSIS — Z20822 Contact with and (suspected) exposure to covid-19: Secondary | ICD-10-CM | POA: Diagnosis not present

## 2020-03-26 DIAGNOSIS — R05 Cough: Secondary | ICD-10-CM | POA: Diagnosis not present

## 2020-03-26 NOTE — Telephone Encounter (Signed)
Error

## 2020-04-04 ENCOUNTER — Other Ambulatory Visit: Payer: Self-pay

## 2020-04-04 ENCOUNTER — Ambulatory Visit (INDEPENDENT_AMBULATORY_CARE_PROVIDER_SITE_OTHER): Payer: Medicaid Other | Admitting: Family Medicine

## 2020-04-04 ENCOUNTER — Encounter: Payer: Self-pay | Admitting: Family Medicine

## 2020-04-04 VITALS — BP 126/80 | HR 106 | Temp 97.9°F | Ht 67.0 in | Wt 276.6 lb

## 2020-04-04 DIAGNOSIS — E876 Hypokalemia: Secondary | ICD-10-CM | POA: Diagnosis not present

## 2020-04-04 DIAGNOSIS — F418 Other specified anxiety disorders: Secondary | ICD-10-CM

## 2020-04-04 DIAGNOSIS — M797 Fibromyalgia: Secondary | ICD-10-CM

## 2020-04-04 DIAGNOSIS — B009 Herpesviral infection, unspecified: Secondary | ICD-10-CM

## 2020-04-04 DIAGNOSIS — K219 Gastro-esophageal reflux disease without esophagitis: Secondary | ICD-10-CM | POA: Diagnosis not present

## 2020-04-04 DIAGNOSIS — Z79899 Other long term (current) drug therapy: Secondary | ICD-10-CM | POA: Diagnosis not present

## 2020-04-04 MED ORDER — ALPRAZOLAM 1 MG PO TABS: ORAL_TABLET | ORAL | 2 refills | Status: DC

## 2020-04-04 MED ORDER — VALACYCLOVIR HCL 500 MG PO TABS: 500.0000 mg | ORAL_TABLET | Freq: Two times a day (BID) | ORAL | 5 refills | Status: DC

## 2020-04-04 MED ORDER — CYCLOBENZAPRINE HCL 10 MG PO TABS: ORAL_TABLET | ORAL | 5 refills | Status: DC

## 2020-04-04 MED ORDER — OMEPRAZOLE 20 MG PO CPDR: DELAYED_RELEASE_CAPSULE | ORAL | 1 refills | Status: DC

## 2020-04-04 MED ORDER — PHENTERMINE HCL 37.5 MG PO TABS: 37.5000 mg | ORAL_TABLET | Freq: Every day | ORAL | 2 refills | Status: DC

## 2020-04-04 NOTE — Progress Notes (Signed)
Patient ID: Christie Morrow, female    DOB: November 25, 1980, 39 y.o.   MRN: [id number]   Chief Complaint  Patient presents with  . med check up   Subjective:    HPI  F/u on depression, anxiety, weight gain.  phq 9 and gad 7 done. Pt states no concerns today.  Pt with history fibromyalgia and arthritis.  Pt taking 450mg  wellbutrin daily and cymbalta 60mg  daily. Pt taking xanax- 1/2 tab 1-2x per day. 1mg  tab daily. Took pristique in past. Felt fuzzy in head.  With her depression was feeling fatigued and dragging. Took phentermine in past.  Helping with weight loss and appetite.   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  Was getting from gyn for genital herpes.  Also history in chart of marijuana use in past - 2017. Also in 2018- positive marijuana use.  Feeling increase in weight gain and fatigue.  Was using phentermine in past.  Wanting to restart this.  Didn't have chest pain, palpitations, or htn. Was on/off of this 3 months at a time. Was at 300 lbs and down to 276 lbs.  Medical History Christie Morrow has a past medical history of Arthritis, Disc disorder, Fibromyalgia, Mental disorder, and Vaginal Pap smear, abnormal.   Outpatient Encounter Medications as of 04/04/2020  Medication Sig  . ALPRAZolam (XANAX) 1 MG tablet Take one half tablet bid prn anxiety  . buPROPion (WELLBUTRIN XL) 150 MG 24 hr tablet Take 1 tablet (150 mg total) by mouth daily.  Marland Kitchen buPROPion (WELLBUTRIN XL) 300 MG 24 hr tablet Take 1 tablet (300 mg total) by mouth daily.  . cyclobenzaprine (FLEXERIL) 10 MG tablet TAKE 1 TABLET BY MOUTH EVERY DAY AT BEDTIME AS NEEDED FOR MUSCLE SPASMS  . DULoxetine (CYMBALTA) 60 MG capsule Take 1 capsule (60 mg total) by mouth daily.  Marland Kitchen omeprazole (PRILOSEC) 20 MG capsule TAKE 1 CAPSULE BY MOUTH ONCE DAILY -  . phentermine (ADIPEX-P) 37.5 MG tablet Take 1 tablet (37.5 mg total) by mouth daily before breakfast.  . valACYclovir (VALTREX) 500 MG tablet Take 1 tablet (500 mg total) by mouth 2  (two) times daily.  . [DISCONTINUED] ALPRAZolam (XANAX) 1 MG tablet TAKE 1/2 TABLET TWICE DAILY AS NEEDED FOR ANXIETY.  . [DISCONTINUED] cyclobenzaprine (FLEXERIL) 10 MG tablet TAKE 1 TABLET BY MOUTH EVERY DAY AT BEDTIME AS NEEDED FOR MUSCLE SPASMS  . [DISCONTINUED] hydrochlorothiazide (HYDRODIURIL) 25 MG tablet Take 1 tablet by mouth once daily  . [DISCONTINUED] omeprazole (PRILOSEC) 20 MG capsule TAKE 1 CAPSULE BY MOUTH ONCE DAILY -  NEED  APPT  FOR  MORE  REFILLS  . [DISCONTINUED] phentermine (ADIPEX-P) 37.5 MG tablet Take 1 tablet (37.5 mg total) by mouth daily before breakfast.  . [DISCONTINUED] valACYclovir (VALTREX) 500 MG tablet Take 1 tablet by mouth twice daily   No facility-administered encounter medications on file as of 04/04/2020.     Review of Systems  Constitutional: Positive for fatigue. Negative for chills and fever.  HENT: Negative for congestion, rhinorrhea and sore throat.   Respiratory: Negative for cough, shortness of breath and wheezing.   Cardiovascular: Negative for chest pain and leg swelling.  Gastrointestinal: Negative for abdominal pain, diarrhea, nausea and vomiting.  Genitourinary: Negative for dysuria and frequency.  Musculoskeletal: Negative for arthralgias and back pain.  Skin: Negative for rash.  Neurological: Negative for dizziness, weakness and headaches.     Vitals BP 126/80   Pulse (!) 106   Temp 97.9 F (36.6 C)   Ht 5\' 7"  (1.702  m)   Wt 276 lb 9.6 oz (125.5 kg)   SpO2 97%   Breastfeeding No   BMI 43.32 kg/m   Objective:   Physical Exam Vitals and nursing note reviewed.  Constitutional:      Appearance: Normal appearance. She is obese.  HENT:     Head: Normocephalic and atraumatic.     Nose: Nose normal.     Mouth/Throat:     Mouth: Mucous membranes are moist.     Pharynx: Oropharynx is clear.  Eyes:     Extraocular Movements: Extraocular movements intact.     Conjunctiva/sclera: Conjunctivae normal.     Pupils: Pupils are  equal, round, and reactive to light.  Cardiovascular:     Rate and Rhythm: Regular rhythm. Tachycardia present.     Pulses: Normal pulses.     Heart sounds: Normal heart sounds.  Pulmonary:     Effort: Pulmonary effort is normal.     Breath sounds: Normal breath sounds. No wheezing, rhonchi or rales.  Musculoskeletal:        General: Normal range of motion.     Right lower leg: No edema.     Left lower leg: No edema.  Skin:    General: Skin is warm and dry.     Findings: No lesion or rash.  Neurological:     General: No focal deficit present.     Mental Status: She is alert and oriented to person, place, and time.  Psychiatric:        Mood and Affect: Mood normal.        Behavior: Behavior normal.     Assessment and Plan   1. Depression with anxiety - cyclobenzaprine (FLEXERIL) 10 MG tablet; TAKE 1 TABLET BY MOUTH EVERY DAY AT BEDTIME AS NEEDED FOR MUSCLE SPASMS  Dispense: 30 tablet; Refill: 5 - ALPRAZolam (XANAX) 1 MG tablet; Take one half tablet bid prn anxiety  Dispense: 30 tablet; Refill: 2  2. Morbid obesity (HCC) - phentermine (ADIPEX-P) 37.5 MG tablet; Take 1 tablet (37.5 mg total) by mouth daily before breakfast.  Dispense: 30 tablet; Refill: 2  3. Fibromyalgia  4. Hypokalemia - Basic metabolic panel  5. HSV-2 infection - valACYclovir (VALTREX) 500 MG tablet; Take 1 tablet (500 mg total) by mouth 2 (two) times daily.  Dispense: 60 tablet; Refill: 5  6. Gastroesophageal reflux disease without esophagitis - omeprazole (PRILOSEC) 20 MG capsule; TAKE 1 CAPSULE BY MOUTH ONCE DAILY -  Dispense: 90 capsule; Refill: 1   Depression and fibromyalgia- cont the cymbalta and wellbutrin.  Has been tolerating. Anxiety- xanax qhs. Obesity- requesting to restart the adipex, advising only 3 months at a time. Had elevated HR but improved at end of visit.  Will cont to monitor. Herpes 2- continue valtrex 500mg  bid.  Controlled sub agreement given and ordered urine drug  screen.  F/u 3 months for recheck anxiety meds and refills.   Pt in agreement with plan.

## 2020-04-09 LAB — TOXASSURE SELECT 13 (MW), URINE

## 2020-04-21 ENCOUNTER — Other Ambulatory Visit: Payer: Self-pay | Admitting: Family Medicine

## 2020-04-22 ENCOUNTER — Telehealth: Payer: Self-pay | Admitting: Family Medicine

## 2020-04-22 MED ORDER — DULOXETINE HCL 60 MG PO CPEP: 60.0000 mg | ORAL_CAPSULE | Freq: Every day | ORAL | 0 refills | Status: DC

## 2020-04-22 NOTE — Telephone Encounter (Signed)
Pt requesting refill on Duloxetine 60 mg capsule. Take one capsul po once daily. Pt last seen 04/04/20 for depression. Please adivse. Thank you  [employer]

## 2020-04-22 NOTE — Telephone Encounter (Signed)
Sent.  Thx.

## 2020-04-23 ENCOUNTER — Other Ambulatory Visit: Payer: Self-pay | Admitting: Family Medicine

## 2020-04-24 ENCOUNTER — Other Ambulatory Visit: Payer: Self-pay | Admitting: Family Medicine

## 2020-05-05 ENCOUNTER — Telehealth: Payer: Self-pay | Admitting: Family Medicine

## 2020-05-05 MED ORDER — TOPIRAMATE 25 MG PO TABS
ORAL_TABLET | ORAL | 1 refills | Status: DC
Start: 2020-05-05 — End: 2020-06-26

## 2020-05-05 NOTE — Telephone Encounter (Signed)
Patient is upset because she said that med worked so good for her and she worked hard to lose weight and she is scared she will gain it all back without the med. Patient is willing to try an no controlled substance alternative but not Topamax because she tried Topamax for her headaches and it had her zoned out in a fog and she couldn't take it.

## 2020-05-05 NOTE — Telephone Encounter (Signed)
Pt would like to know why her phentermine (ADIPEX-P) 37.5 MG tablet was canceled.

## 2020-05-05 NOTE — Telephone Encounter (Signed)
Patient notified and will give an update after she starts the medication.

## 2020-05-05 NOTE — Telephone Encounter (Signed)
Left message

## 2020-05-05 NOTE — Telephone Encounter (Signed)
Yes, unfortunately unable to prescribe any controlled substances with abnormal urine screens.  Pt will have to find another provider for those medications.  We could give her another medication that isn't controlled for help with weight loss, like topamax.   Let me know if she would like to try this.   Thanks,   Dr. Ladona Ridgel

## 2020-05-05 NOTE — Telephone Encounter (Signed)
Patient states she would like to try low dose Topamax ( high dose made her loopy and see if it helps)

## 2020-05-05 NOTE — Telephone Encounter (Signed)
Left message to return call 

## 2020-05-05 NOTE — Telephone Encounter (Signed)
Pt is already on wellbutrin.  Don't have any other options for her.  But I can send her to weight loss if she wants a referral.    Thx.   Dr. Ladona Ridgel

## 2020-05-05 NOTE — Telephone Encounter (Signed)
Pt contacted. Urine test when pt came in back in June; showed marijuana in drug test. Pt went to [facility] drug and got some cough syrup for a cough she had and that showed up in her drug test.  Pt states she has worked so hard to lose weight. Has lost a bunch of weight and with out Adipex she is going to gain a lot of weight back. Please advise. Thank you

## 2020-06-02 ENCOUNTER — Other Ambulatory Visit: Payer: Self-pay | Admitting: Adult Health

## 2020-06-02 ENCOUNTER — Other Ambulatory Visit: Payer: Self-pay | Admitting: Family Medicine

## 2020-06-02 DIAGNOSIS — K219 Gastro-esophageal reflux disease without esophagitis: Secondary | ICD-10-CM

## 2020-06-02 MED ORDER — OMEPRAZOLE 20 MG PO CPDR: DELAYED_RELEASE_CAPSULE | ORAL | 1 refills | Status: DC

## 2020-06-26 MED ORDER — TOPIRAMATE 50 MG PO TABS: 50.0000 mg | ORAL_TABLET | Freq: Every day | ORAL | 1 refills | Status: DC

## 2020-06-27 DIAGNOSIS — Z79899 Other long term (current) drug therapy: Secondary | ICD-10-CM | POA: Diagnosis not present

## 2020-06-27 DIAGNOSIS — R110 Nausea: Secondary | ICD-10-CM | POA: Diagnosis not present

## 2020-06-27 DIAGNOSIS — R1031 Right lower quadrant pain: Secondary | ICD-10-CM | POA: Diagnosis not present

## 2020-06-27 DIAGNOSIS — R109 Unspecified abdominal pain: Secondary | ICD-10-CM | POA: Diagnosis not present

## 2020-07-01 ENCOUNTER — Other Ambulatory Visit: Payer: Self-pay | Admitting: Family Medicine

## 2020-07-29 ENCOUNTER — Other Ambulatory Visit: Payer: Self-pay | Admitting: Family Medicine

## 2020-08-18 ENCOUNTER — Other Ambulatory Visit: Payer: Self-pay | Admitting: Obstetrics & Gynecology

## 2020-08-18 ENCOUNTER — Encounter: Payer: Self-pay | Admitting: *Deleted

## 2020-08-18 MED ORDER — HYDROCHLOROTHIAZIDE 25 MG PO TABS: 25.0000 mg | ORAL_TABLET | Freq: Every day | ORAL | 5 refills | Status: DC

## 2020-09-29 ENCOUNTER — Other Ambulatory Visit: Payer: Self-pay | Admitting: Family Medicine

## 2020-09-29 DIAGNOSIS — M797 Fibromyalgia: Secondary | ICD-10-CM

## 2020-09-29 DIAGNOSIS — B009 Herpesviral infection, unspecified: Secondary | ICD-10-CM

## 2020-09-29 NOTE — Telephone Encounter (Signed)
Sent my chart message to schedule appointment.

## 2020-10-13 ENCOUNTER — Other Ambulatory Visit: Payer: Self-pay | Admitting: Family Medicine

## 2020-10-13 MED ORDER — DULOXETINE HCL 60 MG PO CPEP
60.0000 mg | ORAL_CAPSULE | Freq: Every day | ORAL | 0 refills | Status: DC
Start: 2020-10-13 — End: 2020-11-08

## 2020-10-13 MED ORDER — CYCLOBENZAPRINE HCL 10 MG PO TABS: ORAL_TABLET | ORAL | 0 refills | Status: DC

## 2020-10-13 MED ORDER — VALACYCLOVIR HCL 500 MG PO TABS: 500.0000 mg | ORAL_TABLET | Freq: Two times a day (BID) | ORAL | 0 refills | Status: DC

## 2020-11-07 ENCOUNTER — Encounter: Payer: Self-pay | Admitting: Nurse Practitioner

## 2020-11-07 ENCOUNTER — Other Ambulatory Visit: Payer: Self-pay

## 2020-11-07 ENCOUNTER — Telehealth (INDEPENDENT_AMBULATORY_CARE_PROVIDER_SITE_OTHER): Payer: Medicaid Other | Admitting: Nurse Practitioner

## 2020-11-07 ENCOUNTER — Telehealth: Payer: Self-pay | Admitting: Family Medicine

## 2020-11-07 VITALS — Wt 262.0 lb

## 2020-11-07 DIAGNOSIS — M797 Fibromyalgia: Secondary | ICD-10-CM

## 2020-11-07 DIAGNOSIS — F418 Other specified anxiety disorders: Secondary | ICD-10-CM | POA: Diagnosis not present

## 2020-11-07 DIAGNOSIS — Z634 Disappearance and death of family member: Secondary | ICD-10-CM

## 2020-11-07 NOTE — Telephone Encounter (Signed)
[name], watling are scheduled for a virtual visit with your provider today.    Just as we do with appointments in the office, we must obtain your consent to participate.  Your consent will be active for this visit and any virtual visit you may have with one of our providers in the next 365 days.    If you have a MyChart account, I can also send a copy of this consent to you electronically.  All virtual visits are billed to your insurance company just like a traditional visit in the office.  As this is a virtual visit, video technology does not allow for your provider to perform a traditional examination.  This may limit your provider's ability to fully assess your condition.  If your provider identifies any concerns that need to be evaluated in person or the need to arrange testing such as labs, EKG, etc, we will make arrangements to do so.    Although advances in technology are sophisticated, we cannot ensure that it will always work on either your end or our end.  If the connection with a video visit is poor, we may have to switch to a telephone visit.  With either a video or telephone visit, we are not always able to ensure that we have a secure connection.   I need to obtain your verbal consent now.   Are you willing to proceed with your visit today?   Christie Morrow has provided verbal consent on 11/07/2020 for a virtual visit (video or telephone).   Marlowe Shores, LPN 1/65/7903  8:33 PM

## 2020-11-07 NOTE — Progress Notes (Signed)
Subjective:    Patient ID: Christie Morrow, female    DOB: August 07, 1981, 40 y.o.   MRN: [id number]  HPI Pt here for med check. Pt doing well on meds. Taking Wellbutrin 300 mg and 150 mg along with Cymbalta 60 mg. Pt having issues with sleep. Pt lost best friend to suicide back in June.   Virtual Visit via Video Note  I connected with Christie Morrow on 11/07/20 at  3:20 PM EST by a video enabled telemedicine application and verified that I am speaking with the correct person using two identifiers.  Location: Patient: home Provider: office   I discussed the limitations of evaluation and management by telemedicine and the availability of in person appointments. The patient expressed understanding and agreed to proceed.  History of Present Illness: See note above.  Current medications are working well for her anxiety and depression although now having more trouble sleeping after the suicidal death of her best friend. Denies any personal thoughts or ideation of harming herself or others.  Denies any self-harm behavior. Requesting a refill on her Flexeril that she uses occasionally for muscle spasms.  Patient is off all controlled substances.  Depression screen [facility] 2/9 11/07/2020 04/04/2020 08/31/2019 08/09/2018 08/08/2017  Decreased Interest 0 1 0 2 1  Down, Depressed, Hopeless 1 2 0 1 0  PHQ - 2 Score 1 3 0 3 1  Altered sleeping 3 1 0 1 0  Tired, decreased energy 0 2 1 3 1   Change in appetite 0 1 0 1 0  Feeling bad or failure about yourself  0 2 0 0 0  Trouble concentrating 0 0 0 0 0  Moving slowly or fidgety/restless 0 0 0 0 0  Suicidal thoughts 0 0 0 0 0  PHQ-9 Score 4 9 1 8 2   Difficult doing work/chores Not difficult at all - Not difficult at all Somewhat difficult Not difficult at all   GAD 7 : Generalized Anxiety Score 11/07/2020 04/04/2020 08/31/2019 08/09/2018  Nervous, Anxious, on Edge 1 2 0 2  Control/stop worrying 0 2 1 1   Worry too much - different things 0 2 1 1   Trouble  relaxing 0 1 0 0  Restless 0 1 0 0  Easily annoyed or irritable 1 2 1 2   Afraid - awful might happen 0 3 1 1   Total GAD 7 Score 2 13 4 7   Anxiety Difficulty Not difficult at all - Not difficult at all Somewhat difficult      Observations/Objective: Alert, oriented.  Cheerful affect.  Making good eye contact.  Tearful at times when talking about her friend.  Thoughts logical coherent and relevant.  Assessment and Plan: Problem List Items Addressed This Visit      Other   Depression with anxiety - Primary   Relevant Medications   DULoxetine (CYMBALTA) 60 MG capsule   buPROPion (WELLBUTRIN XL) 150 MG 24 hr tablet   buPROPion (WELLBUTRIN XL) 300 MG 24 hr tablet   hydrOXYzine (ATARAX/VISTARIL) 25 MG tablet   Fibromyalgia   Relevant Medications   DULoxetine (CYMBALTA) 60 MG capsule   buPROPion (WELLBUTRIN XL) 150 MG 24 hr tablet   buPROPion (WELLBUTRIN XL) 300 MG 24 hr tablet   cyclobenzaprine (FLEXERIL) 10 MG tablet    Other Visit Diagnoses    Bereavement         Meds ordered this encounter  Medications  . DULoxetine (CYMBALTA) 60 MG capsule    Sig: Take 1 capsule (60 mg total)  by mouth daily.    Dispense:  90 capsule    Refill:  1    Order Specific Question:   Supervising Provider    Answer:   Lilyan Punt A [[id number]]  . buPROPion (WELLBUTRIN XL) 150 MG 24 hr tablet    Sig: Take 1 tablet (150 mg total) by mouth daily.    Dispense:  90 tablet    Refill:  1    Order Specific Question:   Supervising Provider    Answer:   Lilyan Punt A [[id number]]  . buPROPion (WELLBUTRIN XL) 300 MG 24 hr tablet    Sig: Take 1 tablet (300 mg total) by mouth daily.    Dispense:  90 tablet    Refill:  1    Order Specific Question:   Supervising Provider    Answer:   Lilyan Punt A [[id number]]  . cyclobenzaprine (FLEXERIL) 10 MG tablet    Sig: TAKE 1 TABLET BY MOUTH EVERY DAY AT BEDTIME AS NEEDED FOR MUSCLE SPASMS    Dispense:  30 tablet    Refill:  0    Order Specific Question:   Supervising  Provider    Answer:   Lilyan Punt A [[id number]]  . hydrOXYzine (ATARAX/VISTARIL) 25 MG tablet    Sig: Take 1/2-1 tablet 30 minutes before bedtime    Dispense:  30 tablet    Refill:  2    Order Specific Question:   Supervising Provider    Answer:   Lilyan Punt A [[id number]]     Follow Up Instructions: Trial of hydroxyzine as directed for sleep.  Cautioned patient about potential drowsiness. Continue other medications as directed. Strongly encourage patient to look into some bereavement counseling.  Seek help immediately if any thoughts of hurting herself.  She agrees with this plan. Return in about 6 months (around 05/07/2021) for recheck. Call back sooner if needed.   I discussed the assessment and treatment plan with the patient. The patient was provided an opportunity to ask questions and all were answered. The patient agreed with the plan and demonstrated an understanding of the instructions.   The patient was advised to call back or seek an in-person evaluation if the symptoms worsen or if the condition fails to improve as anticipated.  I provided 15 minutes of non-face-to-face time during this encounter.   Marlowe Shores, LPN    Review of Systems     Objective:   Physical Exam        Assessment & Plan:

## 2020-11-08 ENCOUNTER — Encounter: Payer: Self-pay | Admitting: Nurse Practitioner

## 2020-11-08 MED ORDER — BUPROPION HCL ER (XL) 300 MG PO TB24: 300.0000 mg | ORAL_TABLET | Freq: Every day | ORAL | 1 refills | Status: DC

## 2020-11-08 MED ORDER — DULOXETINE HCL 60 MG PO CPEP: 60.0000 mg | ORAL_CAPSULE | Freq: Every day | ORAL | 1 refills | Status: DC

## 2020-11-08 MED ORDER — HYDROXYZINE HCL 25 MG PO TABS: ORAL_TABLET | ORAL | 2 refills | Status: DC

## 2020-11-08 MED ORDER — BUPROPION HCL ER (XL) 150 MG PO TB24: 150.0000 mg | ORAL_TABLET | Freq: Every day | ORAL | 1 refills | Status: DC

## 2020-11-08 MED ORDER — CYCLOBENZAPRINE HCL 10 MG PO TABS: ORAL_TABLET | ORAL | 0 refills | Status: DC

## 2020-11-10 ENCOUNTER — Encounter: Payer: Self-pay | Admitting: Nurse Practitioner

## 2020-11-12 ENCOUNTER — Other Ambulatory Visit: Payer: Self-pay | Admitting: Nurse Practitioner

## 2020-11-12 DIAGNOSIS — B009 Herpesviral infection, unspecified: Secondary | ICD-10-CM

## 2020-11-12 MED ORDER — VALACYCLOVIR HCL 500 MG PO TABS
500.0000 mg | ORAL_TABLET | Freq: Two times a day (BID) | ORAL | 5 refills | Status: DC
Start: 2020-11-12 — End: 2021-05-14

## 2020-12-10 ENCOUNTER — Other Ambulatory Visit: Payer: Self-pay | Admitting: Nurse Practitioner

## 2020-12-10 DIAGNOSIS — M797 Fibromyalgia: Secondary | ICD-10-CM

## 2020-12-12 ENCOUNTER — Other Ambulatory Visit: Payer: Self-pay | Admitting: Nurse Practitioner

## 2020-12-12 ENCOUNTER — Encounter: Payer: Self-pay | Admitting: Nurse Practitioner

## 2020-12-12 DIAGNOSIS — K219 Gastro-esophageal reflux disease without esophagitis: Secondary | ICD-10-CM

## 2020-12-12 MED ORDER — OMEPRAZOLE 20 MG PO CPDR: DELAYED_RELEASE_CAPSULE | ORAL | 1 refills | Status: DC

## 2021-01-09 ENCOUNTER — Other Ambulatory Visit: Payer: Self-pay | Admitting: Nurse Practitioner

## 2021-01-09 ENCOUNTER — Encounter: Payer: Self-pay | Admitting: Nurse Practitioner

## 2021-01-09 DIAGNOSIS — M797 Fibromyalgia: Secondary | ICD-10-CM

## 2021-01-09 MED ORDER — CYCLOBENZAPRINE HCL 10 MG PO TABS
ORAL_TABLET | ORAL | 0 refills | Status: DC
Start: 2021-01-09 — End: 2021-02-19

## 2021-01-27 DIAGNOSIS — H9201 Otalgia, right ear: Secondary | ICD-10-CM | POA: Diagnosis not present

## 2021-01-29 ENCOUNTER — Encounter: Payer: Self-pay | Admitting: Nurse Practitioner

## 2021-01-30 ENCOUNTER — Other Ambulatory Visit: Payer: Self-pay | Admitting: Nurse Practitioner

## 2021-01-30 MED ORDER — TOPIRAMATE 50 MG PO TABS: 50.0000 mg | ORAL_TABLET | Freq: Every day | ORAL | 0 refills | Status: DC

## 2021-02-04 ENCOUNTER — Other Ambulatory Visit: Payer: Self-pay | Admitting: Nurse Practitioner

## 2021-02-04 MED ORDER — ZOLPIDEM TARTRATE 10 MG PO TABS: 10.0000 mg | ORAL_TABLET | Freq: Every evening | ORAL | 0 refills | Status: DC | PRN

## 2021-02-19 ENCOUNTER — Other Ambulatory Visit: Payer: Self-pay | Admitting: Nurse Practitioner

## 2021-02-19 ENCOUNTER — Encounter: Payer: Self-pay | Admitting: Nurse Practitioner

## 2021-02-19 DIAGNOSIS — M797 Fibromyalgia: Secondary | ICD-10-CM

## 2021-02-19 MED ORDER — CYCLOBENZAPRINE HCL 10 MG PO TABS
ORAL_TABLET | ORAL | 0 refills | Status: DC
Start: 2021-02-19 — End: 2021-03-20

## 2021-02-19 MED ORDER — HYDROXYZINE HCL 25 MG PO TABS: ORAL_TABLET | ORAL | 0 refills | Status: DC

## 2021-02-19 NOTE — Telephone Encounter (Signed)
Needs appt for more refills. In next 30 days.   Thx.   Dr. Ladona Ridgel

## 2021-02-20 ENCOUNTER — Other Ambulatory Visit: Payer: Self-pay | Admitting: Nurse Practitioner

## 2021-02-20 ENCOUNTER — Telehealth: Payer: Self-pay

## 2021-02-20 MED ORDER — HYDROXYZINE HCL 25 MG PO TABS: ORAL_TABLET | ORAL | 0 refills | Status: DC

## 2021-02-20 NOTE — Telephone Encounter (Signed)
Patient prescribed ambien on 4/13. She called saying that she only got #15 and the pharmacy won't refill it again before 5/10.  I told her on my end I show #30 with no refills were sent.  She wants Eber Jones to know that it is working good and that she is sleeping good and wants to continue.  I told her I didn't know if it was an insurance thing that maybe they will only allow so many per month?  She wants a refill or something?  [facility]

## 2021-02-20 NOTE — Telephone Encounter (Signed)
Contacted pharmacy. Pt has Medicaid and they only pay for 15 tablets per 30 days. Medicaid does not want to pay for patient to take it daily only as needed. Please advise. Thank you

## 2021-02-21 NOTE — Telephone Encounter (Signed)
This is most likely an insurance issue if I prescribed #30. They may limit the number they will pay for each month. Three choices: Pay for additional med Take medicine a few times per week. Cut medicine in half. Not sure if it will work as well but that is an option. Thanks!

## 2021-02-23 NOTE — Telephone Encounter (Signed)
Left message to return call 

## 2021-02-27 NOTE — Telephone Encounter (Signed)
Discussed with pt. Pt states she will need a refill and would like Christie Morrow to write for 30 tablets and she will pay the difference. [facility] ( it is the pharm listed in chart)

## 2021-02-27 NOTE — Telephone Encounter (Signed)
Left message to return call 

## 2021-03-01 ENCOUNTER — Other Ambulatory Visit: Payer: Self-pay | Admitting: Nurse Practitioner

## 2021-03-01 MED ORDER — ZOLPIDEM TARTRATE 10 MG PO TABS: 10.0000 mg | ORAL_TABLET | Freq: Every evening | ORAL | 0 refills | Status: DC | PRN

## 2021-03-08 ENCOUNTER — Other Ambulatory Visit: Payer: Self-pay | Admitting: Nurse Practitioner

## 2021-03-19 ENCOUNTER — Other Ambulatory Visit: Payer: Self-pay

## 2021-03-19 ENCOUNTER — Other Ambulatory Visit: Payer: Self-pay | Admitting: Nurse Practitioner

## 2021-03-19 ENCOUNTER — Other Ambulatory Visit: Payer: Self-pay | Admitting: Family Medicine

## 2021-03-19 DIAGNOSIS — M797 Fibromyalgia: Secondary | ICD-10-CM

## 2021-03-20 ENCOUNTER — Encounter: Payer: Self-pay | Admitting: Nurse Practitioner

## 2021-03-20 ENCOUNTER — Other Ambulatory Visit: Payer: Self-pay | Admitting: Nurse Practitioner

## 2021-03-20 MED ORDER — HYDROXYZINE HCL 25 MG PO TABS: ORAL_TABLET | ORAL | 0 refills | Status: DC

## 2021-04-06 ENCOUNTER — Other Ambulatory Visit: Payer: Self-pay | Admitting: Nurse Practitioner

## 2021-04-07 NOTE — Telephone Encounter (Signed)
I do not prescribed controlled substances on this pt due to abnormal drug screen in past year.   Dr. Ladona Ridgel

## 2021-04-07 NOTE — Telephone Encounter (Signed)
Patient states she needs something that helps her to be able to sleep- Is there a different non controlled medication that can be prescribed till Christie Morrow can return and review the message

## 2021-04-13 NOTE — Telephone Encounter (Signed)
Left message to return call to discuss with pt.  

## 2021-04-13 NOTE — Telephone Encounter (Signed)
Patient has been informed per drs previous notes regarding controlled substances. She verbalized understanding.

## 2021-04-15 ENCOUNTER — Encounter: Payer: Self-pay | Admitting: Family Medicine

## 2021-04-15 DIAGNOSIS — Z9114 Patient's other noncompliance with medication regimen: Secondary | ICD-10-CM | POA: Insufficient documentation

## 2021-04-17 ENCOUNTER — Encounter: Payer: Self-pay | Admitting: Nurse Practitioner

## 2021-04-17 ENCOUNTER — Other Ambulatory Visit: Payer: Self-pay

## 2021-04-17 ENCOUNTER — Telehealth: Payer: Self-pay | Admitting: Nurse Practitioner

## 2021-04-17 ENCOUNTER — Other Ambulatory Visit: Payer: Self-pay | Admitting: Nurse Practitioner

## 2021-04-17 DIAGNOSIS — M797 Fibromyalgia: Secondary | ICD-10-CM

## 2021-04-17 NOTE — Telephone Encounter (Signed)
Patient is requesting refill on ambien 10 mg called into [facility]

## 2021-04-22 ENCOUNTER — Other Ambulatory Visit: Payer: Self-pay | Admitting: Family Medicine

## 2021-04-23 ENCOUNTER — Other Ambulatory Visit: Payer: Self-pay | Admitting: Nurse Practitioner

## 2021-04-23 ENCOUNTER — Other Ambulatory Visit: Payer: Self-pay

## 2021-04-23 ENCOUNTER — Encounter: Payer: Self-pay | Admitting: Nurse Practitioner

## 2021-04-23 DIAGNOSIS — M797 Fibromyalgia: Secondary | ICD-10-CM

## 2021-04-23 MED ORDER — HYDROXYZINE HCL 25 MG PO TABS: ORAL_TABLET | ORAL | 2 refills | Status: DC

## 2021-04-23 MED ORDER — CYCLOBENZAPRINE HCL 10 MG PO TABS: ORAL_TABLET | ORAL | 2 refills | Status: DC

## 2021-04-24 NOTE — Telephone Encounter (Signed)
See mt chart message 04/23/21

## 2021-04-24 NOTE — Telephone Encounter (Signed)
See my chart message 04/23/21

## 2021-04-24 NOTE — Telephone Encounter (Signed)
Patient was notified both through my chart message and a phone call.

## 2021-05-08 ENCOUNTER — Other Ambulatory Visit: Payer: Self-pay | Admitting: Obstetrics & Gynecology

## 2021-05-14 ENCOUNTER — Encounter: Payer: Self-pay | Admitting: Nurse Practitioner

## 2021-05-14 ENCOUNTER — Other Ambulatory Visit: Payer: Self-pay

## 2021-05-14 ENCOUNTER — Ambulatory Visit (INDEPENDENT_AMBULATORY_CARE_PROVIDER_SITE_OTHER): Payer: Medicaid Other | Admitting: Nurse Practitioner

## 2021-05-14 VITALS — BP 114/79 | HR 102 | Temp 97.4°F | Ht 67.0 in | Wt 262.0 lb

## 2021-05-14 DIAGNOSIS — Z1329 Encounter for screening for other suspected endocrine disorder: Secondary | ICD-10-CM | POA: Diagnosis not present

## 2021-05-14 DIAGNOSIS — Z13220 Encounter for screening for lipoid disorders: Secondary | ICD-10-CM | POA: Diagnosis not present

## 2021-05-14 DIAGNOSIS — Z0001 Encounter for general adult medical examination with abnormal findings: Secondary | ICD-10-CM | POA: Diagnosis not present

## 2021-05-14 DIAGNOSIS — Z0000 Encounter for general adult medical examination without abnormal findings: Secondary | ICD-10-CM

## 2021-05-14 DIAGNOSIS — K219 Gastro-esophageal reflux disease without esophagitis: Secondary | ICD-10-CM

## 2021-05-14 DIAGNOSIS — R5383 Other fatigue: Secondary | ICD-10-CM

## 2021-05-14 DIAGNOSIS — Z79899 Other long term (current) drug therapy: Secondary | ICD-10-CM | POA: Diagnosis not present

## 2021-05-14 DIAGNOSIS — B009 Herpesviral infection, unspecified: Secondary | ICD-10-CM | POA: Diagnosis not present

## 2021-05-14 DIAGNOSIS — M797 Fibromyalgia: Secondary | ICD-10-CM | POA: Diagnosis not present

## 2021-05-14 NOTE — Progress Notes (Signed)
Subjective:    Patient ID: Christie Morrow, female    DOB: Aug 21, 1981, 40 y.o.   MRN: [id number]  HPI  The patient comes in today for a wellness visit.    A review of their health history was completed.  A review of medications was also completed.  Any needed refills; all meds  Eating habits: good  Falls/  MVA accidents in past few months: none  Regular exercise: active lifestyle  Specialist pt sees on regular basis: none  Preventative health issues were discussed.   Additional concerns: none [facility]. States her drug screen was positive for codeine due to a compound prescription given by [facility] drug for cough.  Patient did not think of this at the time of her urine screen.  States this could be verified by calling pharmacy.  Patient also has occasional use of marijuana.  Because both substances were found in her urine drug screen, no further Ambien was prescribed.  This was explained to patient. Stopped her Topamax for weight loss due to tingling in the hands a feet as well as difficulty focusing and foggy thinking.  The symptoms resolved after stopping medication.  Has been very active with her youngest child. Still having difficulty with her sleep. Depression screen [facility] 2/9 11/07/2020  Decreased Interest 0  Down, Depressed, Hopeless 1  PHQ - 2 Score 1  Altered sleeping 3  Tired, decreased energy 0  Change in appetite 0  Feeling bad or failure about yourself  0  Trouble concentrating 0  Moving slowly or fidgety/restless 0  Suicidal thoughts 0  PHQ-9 Score 4  Difficult doing work/chores Not difficult at all   GAD 7 : Generalized Anxiety Score 11/07/2020 04/04/2020 08/31/2019 08/09/2018  Nervous, Anxious, on Edge 1 2 0 2  Control/stop worrying 0 2 1 1   Worry too much - different things 0 2 1 1   Trouble relaxing 0 1 0 0  Restless 0 1 0 0  Easily annoyed or irritable 1 2 1 2   Afraid - awful might happen 0 3 1 1   Total GAD 7 Score 2 13 4 7    Anxiety Difficulty Not difficult at all - Not difficult at all Somewhat difficult     Review of Systems  Constitutional:  Positive for fatigue. Negative for activity change and appetite change.  Respiratory:  Negative for cough, chest tightness, shortness of breath and wheezing.   Cardiovascular:  Negative for chest pain.  Gastrointestinal:  Negative for abdominal distention, abdominal pain, constipation, diarrhea, nausea and vomiting.  Genitourinary:  Negative for difficulty urinating, dysuria, enuresis, frequency and urgency.  Psychiatric/Behavioral:  Positive for sleep disturbance.       Objective:   Physical Exam Constitutional:      General: She is not in acute distress.    Appearance: She is well-developed.  Neck:     Thyroid: No thyromegaly.     Trachea: No tracheal deviation.     Comments: Thyroid non tender to palpation. No mass or goiter noted.  Cardiovascular:     Rate and Rhythm: Normal rate and regular rhythm.     Heart sounds: Normal heart sounds. No murmur heard. Pulmonary:     Effort: Pulmonary effort is normal.     Breath sounds: Normal breath sounds.  Abdominal:     General: There is no distension.     Palpations: Abdomen is soft.     Tenderness: There is no abdominal tenderness.  Musculoskeletal:  Cervical back: Normal range of motion and neck supple.  Lymphadenopathy:     Cervical: No cervical adenopathy.  Skin:    General: Skin is warm and dry.     Findings: No rash.  Neurological:     Mental Status: She is alert and oriented to person, place, and time.  Psychiatric:        Mood and Affect: Mood normal.        Behavior: Behavior normal.        Thought Content: Thought content normal.        Judgment: Judgment normal.  Today's Vitals   05/14/21 1417  BP: 114/79  Pulse: (!) 102  Temp: (!) 97.4 F (36.3 C)  SpO2: 97%  Weight: 262 lb (118.8 kg)  Height: 5\' 7"  (1.702 m)   Body mass index is 41.04 kg/m.      Assessment & Plan:    Problem List Items Addressed This Visit       Digestive   Gastroesophageal reflux disease without esophagitis   Relevant Medications   omeprazole (PRILOSEC) 20 MG capsule     Other   Fibromyalgia   Relevant Medications   buPROPion (WELLBUTRIN XL) 150 MG 24 hr tablet   buPROPion (WELLBUTRIN XL) 300 MG 24 hr tablet   cyclobenzaprine (FLEXERIL) 10 MG tablet   DULoxetine (CYMBALTA) 60 MG capsule   HSV-2 infection   Relevant Medications   valACYclovir (VALTREX) 500 MG tablet   Other Visit Diagnoses     Routine general medical examination at a health care facility    -  Primary   Relevant Orders   Comprehensive metabolic panel   Lipid panel   High risk medication use       Relevant Orders   Comprehensive metabolic panel   Other fatigue       Relevant Orders   CBC with Differential/Platelet   TSH   Screening for thyroid disorder       Relevant Orders   TSH   Screening for lipid disorders       Relevant Orders   Lipid panel      Meds ordered this encounter  Medications   buPROPion (WELLBUTRIN XL) 150 MG 24 hr tablet    Sig: Take 1 tablet (150 mg total) by mouth daily.    Dispense:  90 tablet    Refill:  1   buPROPion (WELLBUTRIN XL) 300 MG 24 hr tablet    Sig: Take 1 tablet (300 mg total) by mouth daily.    Dispense:  90 tablet    Refill:  1   cyclobenzaprine (FLEXERIL) 10 MG tablet    Sig: TAKE 1 TABLET BY MOUTH EVERY DAY AT BEDTIME AS NEEDED FOR MUSCLE SPASM    Dispense:  30 tablet    Refill:  2   DULoxetine (CYMBALTA) 60 MG capsule    Sig: Take 1 capsule (60 mg total) by mouth daily.    Dispense:  90 capsule    Refill:  1   hydrochlorothiazide (HYDRODIURIL) 25 MG tablet    Sig: Take 1 tablet (25 mg total) by mouth daily.    Dispense:  90 tablet    Refill:  1   hydrOXYzine (ATARAX/VISTARIL) 25 MG tablet    Sig: Take 1/2-1 tablet 30 minutes before bedtime    Dispense:  30 tablet    Refill:  2   omeprazole (PRILOSEC) 20 MG capsule    Sig: TAKE 1 CAPSULE  BY MOUTH ONCE DAILY -  Dispense:  90 capsule    Refill:  1   valACYclovir (VALTREX) 500 MG tablet    Sig: Take 1 tablet (500 mg total) by mouth 2 (two) times daily.    Dispense:  60 tablet    Refill:  5  Labs pending. Recommend patient schedule appointment for her GYN physical and Pap smear. Patient given 6 months refills on medications to allow her time to find a new provider. A review of potential interactions after visit indicates trazodone may interact with bupropion especially high dose. Patient consider herbal or natural supplements such as melatonin for sleep. Has had a slow steady weight loss.  Encourage continued healthy diet activity and weight loss efforts. Recommend follow-up in 6 months.

## 2021-05-15 ENCOUNTER — Encounter: Payer: Self-pay | Admitting: Nurse Practitioner

## 2021-05-15 MED ORDER — HYDROXYZINE HCL 25 MG PO TABS: ORAL_TABLET | ORAL | 2 refills | Status: DC

## 2021-05-15 MED ORDER — CYCLOBENZAPRINE HCL 10 MG PO TABS: ORAL_TABLET | ORAL | 2 refills | Status: DC

## 2021-05-15 MED ORDER — BUPROPION HCL ER (XL) 150 MG PO TB24: 150.0000 mg | ORAL_TABLET | Freq: Every day | ORAL | 1 refills | Status: DC

## 2021-05-15 MED ORDER — HYDROCHLOROTHIAZIDE 25 MG PO TABS: 25.0000 mg | ORAL_TABLET | Freq: Every day | ORAL | 1 refills | Status: DC

## 2021-05-15 MED ORDER — BUPROPION HCL ER (XL) 300 MG PO TB24: 300.0000 mg | ORAL_TABLET | Freq: Every day | ORAL | 1 refills | Status: DC

## 2021-05-15 MED ORDER — VALACYCLOVIR HCL 500 MG PO TABS: 500.0000 mg | ORAL_TABLET | Freq: Two times a day (BID) | ORAL | 5 refills | Status: DC

## 2021-05-15 MED ORDER — OMEPRAZOLE 20 MG PO CPDR: DELAYED_RELEASE_CAPSULE | ORAL | 1 refills | Status: DC

## 2021-05-15 MED ORDER — DULOXETINE HCL 60 MG PO CPEP: 60.0000 mg | ORAL_CAPSULE | Freq: Every day | ORAL | 1 refills | Status: DC

## 2021-05-27 ENCOUNTER — Other Ambulatory Visit: Payer: Self-pay | Admitting: Nurse Practitioner

## 2021-05-27 MED ORDER — TRAZODONE HCL 50 MG PO TABS: 25.0000 mg | ORAL_TABLET | Freq: Every evening | ORAL | 2 refills | Status: DC | PRN

## 2021-05-28 ENCOUNTER — Other Ambulatory Visit: Payer: Self-pay | Admitting: Nurse Practitioner

## 2021-05-28 MED ORDER — BUPROPION HCL ER (XL) 150 MG PO TB24: 150.0000 mg | ORAL_TABLET | Freq: Every day | ORAL | 1 refills | Status: DC

## 2021-07-23 ENCOUNTER — Encounter: Payer: Self-pay | Admitting: Nurse Practitioner

## 2021-08-20 ENCOUNTER — Other Ambulatory Visit: Payer: Self-pay | Admitting: Nurse Practitioner

## 2021-08-31 ENCOUNTER — Encounter: Payer: Self-pay | Admitting: Nurse Practitioner

## 2021-09-01 ENCOUNTER — Other Ambulatory Visit: Payer: Self-pay | Admitting: Nurse Practitioner

## 2021-09-01 DIAGNOSIS — M797 Fibromyalgia: Secondary | ICD-10-CM

## 2021-09-01 MED ORDER — CYCLOBENZAPRINE HCL 10 MG PO TABS: ORAL_TABLET | ORAL | 2 refills | Status: DC

## 2021-09-21 ENCOUNTER — Other Ambulatory Visit: Payer: Self-pay | Admitting: Nurse Practitioner

## 2021-09-23 ENCOUNTER — Telehealth: Payer: Self-pay | Admitting: Nurse Practitioner

## 2021-09-23 MED ORDER — TRAZODONE HCL 50 MG PO TABS: ORAL_TABLET | ORAL | 0 refills | Status: DC

## 2021-09-23 NOTE — Telephone Encounter (Signed)
Sent my chart message 09/23/21 

## 2021-10-06 ENCOUNTER — Ambulatory Visit: Payer: Medicaid Other | Admitting: Family Medicine

## 2021-10-06 DIAGNOSIS — Z23 Encounter for immunization: Secondary | ICD-10-CM | POA: Diagnosis not present

## 2021-10-12 NOTE — Telephone Encounter (Signed)
Patient no show appointment and hasnt rescheduled

## 2021-10-22 ENCOUNTER — Ambulatory Visit: Payer: Medicaid Other | Admitting: Family Medicine

## 2021-10-28 ENCOUNTER — Telehealth: Payer: Self-pay | Admitting: Family Medicine

## 2021-10-28 NOTE — Telephone Encounter (Signed)
Patient is requesting refill on trazodone 50 mg, again it was suggested that she find a provider were she lives. Patient states that she would. [facility] [address] [place] [address]

## 2021-10-29 ENCOUNTER — Other Ambulatory Visit: Payer: Self-pay | Admitting: Nurse Practitioner

## 2021-10-29 MED ORDER — TRAZODONE HCL 50 MG PO TABS: ORAL_TABLET | ORAL | 0 refills | Status: DC

## 2021-10-30 ENCOUNTER — Ambulatory Visit: Payer: Medicaid Other | Admitting: Family Medicine

## 2021-11-24 DIAGNOSIS — N812 Incomplete uterovaginal prolapse: Secondary | ICD-10-CM | POA: Diagnosis not present

## 2021-11-24 DIAGNOSIS — N3946 Mixed incontinence: Secondary | ICD-10-CM | POA: Diagnosis not present

## 2021-11-24 DIAGNOSIS — Z124 Encounter for screening for malignant neoplasm of cervix: Secondary | ICD-10-CM | POA: Diagnosis not present

## 2021-12-03 ENCOUNTER — Encounter: Payer: Self-pay | Admitting: Nurse Practitioner

## 2021-12-04 ENCOUNTER — Other Ambulatory Visit: Payer: Self-pay | Admitting: Nurse Practitioner

## 2021-12-04 DIAGNOSIS — M797 Fibromyalgia: Secondary | ICD-10-CM

## 2021-12-04 MED ORDER — CYCLOBENZAPRINE HCL 10 MG PO TABS: ORAL_TABLET | ORAL | 0 refills | Status: DC

## 2021-12-04 MED ORDER — TRAZODONE HCL 50 MG PO TABS: ORAL_TABLET | ORAL | 0 refills | Status: DC

## 2021-12-04 NOTE — Progress Notes (Signed)
Patient is in transition to another practice where she lives. Taking time to switch Medicaid. Will give one more refill on Trazodone and Flexeril.

## 2021-12-22 DIAGNOSIS — N393 Stress incontinence (female) (male): Secondary | ICD-10-CM | POA: Diagnosis not present

## 2022-01-01 ENCOUNTER — Encounter: Payer: Self-pay | Admitting: Nurse Practitioner

## 2022-01-01 ENCOUNTER — Other Ambulatory Visit: Payer: Self-pay | Admitting: Nurse Practitioner

## 2022-01-01 DIAGNOSIS — M797 Fibromyalgia: Secondary | ICD-10-CM

## 2022-01-01 DIAGNOSIS — B009 Herpesviral infection, unspecified: Secondary | ICD-10-CM

## 2022-01-01 DIAGNOSIS — K219 Gastro-esophageal reflux disease without esophagitis: Secondary | ICD-10-CM

## 2022-01-01 MED ORDER — HYDROCHLOROTHIAZIDE 25 MG PO TABS: 25.0000 mg | ORAL_TABLET | Freq: Every day | ORAL | 0 refills | Status: DC

## 2022-01-01 MED ORDER — CYCLOBENZAPRINE HCL 10 MG PO TABS: ORAL_TABLET | ORAL | 0 refills | Status: DC

## 2022-01-01 MED ORDER — DULOXETINE HCL 60 MG PO CPEP: 60.0000 mg | ORAL_CAPSULE | Freq: Every day | ORAL | 0 refills | Status: DC

## 2022-01-01 MED ORDER — BUPROPION HCL ER (XL) 300 MG PO TB24: 300.0000 mg | ORAL_TABLET | Freq: Every day | ORAL | 0 refills | Status: DC

## 2022-01-01 MED ORDER — VALACYCLOVIR HCL 500 MG PO TABS: 500.0000 mg | ORAL_TABLET | Freq: Two times a day (BID) | ORAL | 0 refills | Status: DC

## 2022-01-01 MED ORDER — TRAZODONE HCL 50 MG PO TABS: ORAL_TABLET | ORAL | 0 refills | Status: DC

## 2022-01-01 MED ORDER — HYDROXYZINE HCL 25 MG PO TABS: ORAL_TABLET | ORAL | 0 refills | Status: DC

## 2022-01-01 MED ORDER — OMEPRAZOLE 20 MG PO CPDR: DELAYED_RELEASE_CAPSULE | ORAL | 0 refills | Status: DC

## 2022-01-01 MED ORDER — BUPROPION HCL ER (XL) 150 MG PO TB24: 150.0000 mg | ORAL_TABLET | Freq: Every day | ORAL | 0 refills | Status: DC

## 2022-01-22 ENCOUNTER — Encounter: Payer: Self-pay | Admitting: Nurse Practitioner

## 2022-01-27 ENCOUNTER — Ambulatory Visit: Payer: Medicaid Other | Admitting: Family Medicine

## 2022-01-27 DIAGNOSIS — B009 Herpesviral infection, unspecified: Secondary | ICD-10-CM

## 2022-01-27 DIAGNOSIS — I10 Essential (primary) hypertension: Secondary | ICD-10-CM | POA: Diagnosis not present

## 2022-01-27 DIAGNOSIS — K219 Gastro-esophageal reflux disease without esophagitis: Secondary | ICD-10-CM | POA: Diagnosis not present

## 2022-01-27 DIAGNOSIS — F418 Other specified anxiety disorders: Secondary | ICD-10-CM | POA: Diagnosis not present

## 2022-01-27 DIAGNOSIS — M797 Fibromyalgia: Secondary | ICD-10-CM | POA: Diagnosis not present

## 2022-01-27 MED ORDER — DULOXETINE HCL 60 MG PO CPEP: 60.0000 mg | ORAL_CAPSULE | Freq: Every day | ORAL | 1 refills | Status: DC

## 2022-01-27 MED ORDER — CYCLOBENZAPRINE HCL 10 MG PO TABS: ORAL_TABLET | ORAL | 0 refills | Status: DC

## 2022-01-27 MED ORDER — VALACYCLOVIR HCL 500 MG PO TABS: 500.0000 mg | ORAL_TABLET | Freq: Two times a day (BID) | ORAL | 1 refills | Status: DC

## 2022-01-27 MED ORDER — TRAZODONE HCL 50 MG PO TABS: ORAL_TABLET | ORAL | 1 refills | Status: DC

## 2022-01-27 MED ORDER — HYDROCHLOROTHIAZIDE 25 MG PO TABS: 25.0000 mg | ORAL_TABLET | Freq: Every day | ORAL | 1 refills | Status: DC

## 2022-01-27 MED ORDER — BUPROPION HCL ER (XL) 150 MG PO TB24: 150.0000 mg | ORAL_TABLET | Freq: Every day | ORAL | 1 refills | Status: DC

## 2022-01-27 MED ORDER — HYDROXYZINE HCL 25 MG PO TABS: 25.0000 mg | ORAL_TABLET | Freq: Three times a day (TID) | ORAL | 1 refills | Status: DC | PRN

## 2022-01-27 MED ORDER — OMEPRAZOLE 20 MG PO CPDR: DELAYED_RELEASE_CAPSULE | ORAL | 1 refills | Status: DC

## 2022-01-27 MED ORDER — BUPROPION HCL ER (XL) 300 MG PO TB24: 300.0000 mg | ORAL_TABLET | Freq: Every day | ORAL | 1 refills | Status: DC

## 2022-01-27 NOTE — Patient Instructions (Addendum)
Medication as prescribed. ? ?Follow up with Eber Jones later this year. ? ?Take care ? ?Dr. Adriana Simas  ?

## 2022-01-28 ENCOUNTER — Encounter: Payer: Self-pay | Admitting: Family Medicine

## 2022-01-28 NOTE — Telephone Encounter (Signed)
Dr Adriana Simas advised to call patient and let her know her insurance (listed as Medicaid) does not cover weight loss medications. ?

## 2022-01-28 NOTE — Assessment & Plan Note (Signed)
Stable. Continue HCTZ. 

## 2022-01-28 NOTE — Progress Notes (Signed)
? ?Subjective:  ?Patient ID: Christie Morrow, female    DOB: Jan 26, 1981  Age: 41 y.o. MRN: [id number] ? ?CC: ?Chief Complaint  ?Patient presents with  ? anxiety / depresson  ?  Feeling jittery   ? Medication Refill  ? ? ?HPI: ? ?41 year old female with depression and anxiety, fibromyalgia, hypertension presents for follow-up. ? ?Patient states that she has been going through a lot of stress.  Daughter has been bullied at school and this has been a big stressor.  She feels like her depression and anxiety is worsening.  She states that she often feels jittery or restless.  She is currently on Wellbutrin, hydroxyzine, and Atarax.  PHQ-9 score of 15.  GAD-7 score of 17.  She would like to discuss this further today. ? ?BP is well controlled on HCTZ.  Needs refills. ? ?Patient Active Problem List  ? Diagnosis Date Noted  ? Controlled substance agreement broken 04/15/2021  ? Encounter for IUD insertion 04/05/2018  ? History of postpartum hemorrhage 04/04/2018  ? Cervical high risk human papillomavirus (HPV) DNA test positive 08/18/2017  ? 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 Depression with anxiety 12/31/2016  ? Chronic pain of both knees 12/31/2016  ? Gastroesophageal reflux disease without esophagitis 12/31/2016  ? Fibromyalgia 10/06/2015  ? Essential hypertension 10/06/2015  ? Insomnia 10/06/2015  ? Morbid obesity ([place]) 10/06/2015  ? ? ?Social Hx   ?Social History  ? ?Socioeconomic History  ? Marital status: Married  ?  Spouse name: Not on file  ? Number of children: Not on file  ? Years of education: Not on file  ? Highest education level: Not on file  ?Occupational History  ? Not on file  ?Tobacco Use  ? Smoking status: Never  ? Smokeless tobacco: Never  ?Vaping Use  ? Vaping Use: Never used  ?Substance and Sexual Activity  ? Alcohol use: No  ? Drug use: No  ? Sexual activity: Yes  ?  Birth control/protection: I.U.D.  ?Other Topics Concern  ? Not on file  ?Social History Narrative  ? Not on file  ? ?Social Determinants of  Health  ? ?Financial Resource Strain: Not on file  ?Food Insecurity: Not on file  ?Transportation Needs: Not on file  ?Physical Activity: Not on file  ?Stress: Not on file  ?Social Connections: Not on file  ? ? ?Review of Systems  ?Constitutional: Negative.   ?Psychiatric/Behavioral:  The patient is nervous/anxious.   ? ?Objective:  ?BP 118/84   Pulse (!) 103   Temp 98.1 ?F (36.7 ?C)   Ht 5\' 7"  (1.702 m)   Wt 281 lb (127.5 kg)   SpO2 98%   BMI 44.01 kg/m?  ? ? ?  01/27/2022  ?  1:55 PM 05/14/2021  ?  2:17 PM 11/07/2020  ?  1:52 PM  ?BP/Weight  ?Systolic BP 123456 [id number]   ?Diastolic BP 84 79   ?Wt. (Lbs) 281 262 262  ?BMI 44.01 kg/m2 41.04 kg/m2 41.04 kg/m2  ? ?Physical Exam ?Vitals and nursing note reviewed.  ?Constitutional:   ?   General: She is not in acute distress. ?   Appearance: Normal appearance. She is obese. She is not ill-appearing.  ?HENT:  ?   Head: Normocephalic and atraumatic.  ?Cardiovascular:  ?   Rate and Rhythm: Normal rate and regular rhythm.  ?Pulmonary:  ?   Effort: Pulmonary effort is normal.  ?   Breath sounds: Normal breath sounds. No wheezing, rhonchi or rales.  ?Neurological:  ?  Mental Status: She is alert.  ?Psychiatric:     ?   Mood and Affect: Mood normal.     ?   Behavior: Behavior normal.  ? ? ?Lab Results  ?Component Value Date  ? WBC 13.9 (H) 02/04/2018  ? HGB 12.5 04/04/2018  ? HCT 26.6 (L) 02/04/2018  ? PLT 275 02/04/2018  ? GLUCOSE 119 (H) 02/04/2018  ? CHOL 201 (H) 04/01/2017  ? TRIG 143 04/01/2017  ? HDL 34 (L) 04/01/2017  ? LDLCALC 138 (H) 04/01/2017  ? ALT 17 02/04/2018  ? AST 22 02/04/2018  ? NA 133 (L) 02/04/2018  ? K 3.7 02/04/2018  ? CL 101 02/04/2018  ? CREATININE 0.57 02/04/2018  ? BUN 6 02/04/2018  ? CO2 21 (L) 02/04/2018  ? TSH 1.440 04/01/2017  ? INR 0.98 06/28/2010  ? ? ? ?Assessment & Plan:  ? ?Problem List Items Addressed This Visit   ? ?  ? Cardiovascular and Mediastinum  ? Essential hypertension  ?  Stable.  Continue HCTZ. ?  ?  ? Relevant Medications  ?  hydrochlorothiazide (HYDRODIURIL) 25 MG tablet  ?  ? Digestive  ? Gastroesophageal reflux disease without esophagitis  ? Relevant Medications  ? omeprazole (PRILOSEC) 20 MG capsule  ?  ? Other  ? Fibromyalgia  ? Relevant Medications  ? buPROPion (WELLBUTRIN XL) 150 MG 24 hr tablet  ? buPROPion (WELLBUTRIN XL) 300 MG 24 hr tablet  ? cyclobenzaprine (FLEXERIL) 10 MG tablet  ? DULoxetine (CYMBALTA) 60 MG capsule  ? traZODone (DESYREL) 50 MG tablet  ? Depression with anxiety  ?  Uncontrolled/worsening.  Continue Wellbutrin.  Atarax changed to 3 times daily as needed. ?  ?  ? Relevant Medications  ? buPROPion (WELLBUTRIN XL) 150 MG 24 hr tablet  ? buPROPion (WELLBUTRIN XL) 300 MG 24 hr tablet  ? DULoxetine (CYMBALTA) 60 MG capsule  ? hydrOXYzine (ATARAX) 25 MG tablet  ? traZODone (DESYREL) 50 MG tablet  ? HSV-2 infection  ? Relevant Medications  ? valACYclovir (VALTREX) 500 MG tablet  ? ? ?Meds ordered this encounter  ?Medications  ? buPROPion (WELLBUTRIN XL) 150 MG 24 hr tablet  ?  Sig: Take 1 tablet (150 mg total) by mouth daily.  ?  Dispense:  90 tablet  ?  Refill:  1  ? buPROPion (WELLBUTRIN XL) 300 MG 24 hr tablet  ?  Sig: Take 1 tablet (300 mg total) by mouth daily.  ?  Dispense:  90 tablet  ?  Refill:  1  ? cyclobenzaprine (FLEXERIL) 10 MG tablet  ?  Sig: TAKE 1 TABLET BY MOUTH EVERY DAY AT BEDTIME AS NEEDED FOR MUSCLE SPASM  ?  Dispense:  30 tablet  ?  Refill:  0  ? DULoxetine (CYMBALTA) 60 MG capsule  ?  Sig: Take 1 capsule (60 mg total) by mouth daily.  ?  Dispense:  90 capsule  ?  Refill:  1  ? hydrochlorothiazide (HYDRODIURIL) 25 MG tablet  ?  Sig: Take 1 tablet (25 mg total) by mouth daily.  ?  Dispense:  90 tablet  ?  Refill:  1  ? hydrOXYzine (ATARAX) 25 MG tablet  ?  Sig: Take 1 tablet (25 mg total) by mouth every 8 (eight) hours as needed for anxiety.  ?  Dispense:  90 tablet  ?  Refill:  1  ? omeprazole (PRILOSEC) 20 MG capsule  ?  Sig: TAKE 1 CAPSULE BY MOUTH ONCE DAILY -  ?  Dispense:  90 capsule  ?   Refill:  1  ? traZODone (DESYREL) 50 MG tablet  ?  Sig: TAKE 1/2 TO 1 (ONE-HALF TO ONE) TABLET BY MOUTH AT BEDTIME AS NEEDED FOR SLEEP  ?  Dispense:  90 tablet  ?  Refill:  1  ? valACYclovir (VALTREX) 500 MG tablet  ?  Sig: Take 1 tablet (500 mg total) by mouth 2 (two) times daily.  ?  Dispense:  180 tablet  ?  Refill:  1  ? ? ?Thersa Salt DO ?[place] ? ?

## 2022-01-28 NOTE — Assessment & Plan Note (Signed)
Uncontrolled/worsening.  Continue Wellbutrin.  Atarax changed to 3 times daily as needed. ?

## 2022-02-01 ENCOUNTER — Encounter: Payer: Self-pay | Admitting: Family Medicine

## 2022-02-05 ENCOUNTER — Other Ambulatory Visit: Payer: Self-pay | Admitting: Family Medicine

## 2022-02-05 ENCOUNTER — Telehealth: Payer: Self-pay | Admitting: Family Medicine

## 2022-02-05 MED ORDER — SEMAGLUTIDE-WEIGHT MANAGEMENT 0.25 MG/0.5ML ~~LOC~~ SOAJ: 0.2500 mg | SUBCUTANEOUS | 0 refills | Status: DC

## 2022-02-05 NOTE — Telephone Encounter (Signed)
Wegovy 0.25/0.5 mg approved through [employer] from 02/05/22-08/07/22. Reference number [id number]. My chart message sent to patient.  ?

## 2022-02-25 ENCOUNTER — Other Ambulatory Visit: Payer: Self-pay | Admitting: Family Medicine

## 2022-02-25 DIAGNOSIS — M797 Fibromyalgia: Secondary | ICD-10-CM

## 2022-02-25 MED ORDER — CYCLOBENZAPRINE HCL 10 MG PO TABS: ORAL_TABLET | ORAL | 0 refills | Status: DC

## 2022-02-28 ENCOUNTER — Encounter: Payer: Self-pay | Admitting: Nurse Practitioner

## 2022-03-03 ENCOUNTER — Encounter: Payer: Self-pay | Admitting: Family Medicine

## 2022-03-03 ENCOUNTER — Other Ambulatory Visit: Payer: Self-pay | Admitting: Family Medicine

## 2022-03-03 NOTE — Telephone Encounter (Signed)
My chart message sent to patient earlier asking for fax number ?

## 2022-03-17 DIAGNOSIS — N814 Uterovaginal prolapse, unspecified: Secondary | ICD-10-CM | POA: Diagnosis not present

## 2022-03-17 DIAGNOSIS — Z01812 Encounter for preprocedural laboratory examination: Secondary | ICD-10-CM | POA: Diagnosis not present

## 2022-03-17 DIAGNOSIS — N393 Stress incontinence (female) (male): Secondary | ICD-10-CM | POA: Diagnosis not present

## 2022-03-23 DIAGNOSIS — N812 Incomplete uterovaginal prolapse: Secondary | ICD-10-CM | POA: Diagnosis not present

## 2022-03-23 DIAGNOSIS — N393 Stress incontinence (female) (male): Secondary | ICD-10-CM | POA: Diagnosis not present

## 2022-03-23 DIAGNOSIS — N816 Rectocele: Secondary | ICD-10-CM | POA: Diagnosis not present

## 2022-03-29 DIAGNOSIS — R1030 Lower abdominal pain, unspecified: Secondary | ICD-10-CM | POA: Diagnosis not present

## 2022-03-29 DIAGNOSIS — G8918 Other acute postprocedural pain: Secondary | ICD-10-CM | POA: Diagnosis not present

## 2022-03-29 DIAGNOSIS — N390 Urinary tract infection, site not specified: Secondary | ICD-10-CM | POA: Diagnosis not present

## 2022-03-30 ENCOUNTER — Other Ambulatory Visit: Payer: Self-pay | Admitting: Family Medicine

## 2022-03-30 DIAGNOSIS — M797 Fibromyalgia: Secondary | ICD-10-CM

## 2022-03-30 MED ORDER — CYCLOBENZAPRINE HCL 10 MG PO TABS: ORAL_TABLET | ORAL | 0 refills | Status: DC

## 2022-04-21 ENCOUNTER — Other Ambulatory Visit: Payer: Self-pay | Admitting: Family Medicine

## 2022-04-29 ENCOUNTER — Other Ambulatory Visit: Payer: Self-pay | Admitting: Family Medicine

## 2022-04-29 DIAGNOSIS — M797 Fibromyalgia: Secondary | ICD-10-CM

## 2022-04-29 MED ORDER — CYCLOBENZAPRINE HCL 10 MG PO TABS: ORAL_TABLET | ORAL | 0 refills | Status: DC

## 2022-05-17 ENCOUNTER — Other Ambulatory Visit: Payer: Self-pay | Admitting: Family Medicine

## 2022-06-01 ENCOUNTER — Other Ambulatory Visit: Payer: Self-pay | Admitting: Family Medicine

## 2022-06-01 DIAGNOSIS — M797 Fibromyalgia: Secondary | ICD-10-CM

## 2022-06-01 MED ORDER — CYCLOBENZAPRINE HCL 10 MG PO TABS: ORAL_TABLET | ORAL | 0 refills | Status: DC

## 2022-06-13 ENCOUNTER — Other Ambulatory Visit: Payer: Self-pay | Admitting: Family Medicine

## 2022-07-09 ENCOUNTER — Other Ambulatory Visit: Payer: Self-pay | Admitting: Family Medicine

## 2022-07-09 DIAGNOSIS — M797 Fibromyalgia: Secondary | ICD-10-CM

## 2022-07-09 MED ORDER — CYCLOBENZAPRINE HCL 10 MG PO TABS: ORAL_TABLET | ORAL | 0 refills | Status: DC

## 2022-07-12 ENCOUNTER — Other Ambulatory Visit: Payer: Self-pay | Admitting: Family Medicine

## 2022-07-17 ENCOUNTER — Other Ambulatory Visit: Payer: Self-pay | Admitting: Family Medicine

## 2022-07-17 DIAGNOSIS — K219 Gastro-esophageal reflux disease without esophagitis: Secondary | ICD-10-CM

## 2022-08-02 ENCOUNTER — Other Ambulatory Visit: Payer: Self-pay | Admitting: Family Medicine

## 2022-08-02 ENCOUNTER — Encounter: Payer: Self-pay | Admitting: Family Medicine

## 2022-08-03 ENCOUNTER — Other Ambulatory Visit: Payer: Self-pay | Admitting: Family Medicine

## 2022-08-03 ENCOUNTER — Encounter: Payer: Self-pay | Admitting: Family Medicine

## 2022-08-03 MED ORDER — SEMAGLUTIDE-WEIGHT MANAGEMENT 0.25 MG/0.5ML ~~LOC~~ SOAJ: 0.2500 mg | SUBCUTANEOUS | 0 refills | Status: AC

## 2022-08-09 ENCOUNTER — Other Ambulatory Visit: Payer: Self-pay | Admitting: Family Medicine

## 2022-08-09 DIAGNOSIS — M797 Fibromyalgia: Secondary | ICD-10-CM

## 2022-08-09 MED ORDER — CYCLOBENZAPRINE HCL 10 MG PO TABS: ORAL_TABLET | ORAL | 0 refills | Status: DC

## 2022-08-10 ENCOUNTER — Encounter: Payer: Self-pay | Admitting: Family Medicine

## 2022-08-12 ENCOUNTER — Other Ambulatory Visit: Payer: Self-pay | Admitting: Family Medicine

## 2022-08-12 ENCOUNTER — Encounter: Payer: Self-pay | Admitting: Family Medicine

## 2022-08-16 ENCOUNTER — Encounter: Payer: Self-pay | Admitting: Family Medicine

## 2022-08-19 ENCOUNTER — Other Ambulatory Visit: Payer: Self-pay | Admitting: Family Medicine

## 2022-08-30 ENCOUNTER — Other Ambulatory Visit: Payer: Self-pay | Admitting: Family Medicine

## 2022-08-31 ENCOUNTER — Other Ambulatory Visit: Payer: Self-pay | Admitting: Family Medicine

## 2022-08-31 ENCOUNTER — Encounter: Payer: Self-pay | Admitting: Family Medicine

## 2022-09-24 ENCOUNTER — Other Ambulatory Visit: Payer: Self-pay | Admitting: Family Medicine

## 2022-09-24 DIAGNOSIS — M797 Fibromyalgia: Secondary | ICD-10-CM

## 2022-09-24 MED ORDER — CYCLOBENZAPRINE HCL 10 MG PO TABS: ORAL_TABLET | ORAL | 5 refills | Status: AC

## 2022-10-16 ENCOUNTER — Other Ambulatory Visit: Payer: Self-pay | Admitting: Family Medicine

## 2022-10-16 DIAGNOSIS — K219 Gastro-esophageal reflux disease without esophagitis: Secondary | ICD-10-CM

## 2022-10-29 ENCOUNTER — Other Ambulatory Visit: Payer: Self-pay | Admitting: Family Medicine

## 2022-10-29 DIAGNOSIS — B009 Herpesviral infection, unspecified: Secondary | ICD-10-CM

## 2022-10-29 MED ORDER — HYDROXYZINE HCL 25 MG PO TABS: 25.0000 mg | ORAL_TABLET | Freq: Three times a day (TID) | ORAL | 0 refills | Status: DC | PRN

## 2022-11-23 ENCOUNTER — Other Ambulatory Visit: Payer: Self-pay | Admitting: Family Medicine

## 2022-11-29 ENCOUNTER — Other Ambulatory Visit: Payer: Self-pay | Admitting: Family Medicine

## 2023-01-24 ENCOUNTER — Other Ambulatory Visit: Payer: Self-pay | Admitting: Family Medicine

## 2023-02-07 ENCOUNTER — Other Ambulatory Visit: Payer: Self-pay | Admitting: Family Medicine

## 2023-02-09 ENCOUNTER — Other Ambulatory Visit: Payer: Self-pay | Admitting: Family Medicine

## 2023-02-21 ENCOUNTER — Encounter: Payer: Self-pay | Admitting: Family Medicine

## 2023-02-21 ENCOUNTER — Other Ambulatory Visit: Payer: Self-pay | Admitting: Family Medicine

## 2023-02-21 MED ORDER — HYDROXYZINE HCL 25 MG PO TABS: 25.0000 mg | ORAL_TABLET | Freq: Three times a day (TID) | ORAL | 0 refills | Status: AC | PRN

## 2023-02-21 MED ORDER — TRAZODONE HCL 50 MG PO TABS: 50.0000 mg | ORAL_TABLET | Freq: Every evening | ORAL | 1 refills | Status: AC | PRN

## 2023-03-18 ENCOUNTER — Other Ambulatory Visit: Payer: Self-pay | Admitting: Family Medicine

## 2023-04-07 ENCOUNTER — Other Ambulatory Visit: Payer: Self-pay | Admitting: Family Medicine

## 2023-04-07 DIAGNOSIS — B009 Herpesviral infection, unspecified: Secondary | ICD-10-CM

## 2023-05-09 ENCOUNTER — Telehealth: Payer: Self-pay

## 2023-05-09 NOTE — Transitions of Care (Post Inpatient/ED Visit) (Signed)
   05/09/2023  Name: Christie Morrow MRN: [id number] DOB: 1980/12/11  Today's TOC FU Call Status: Today's TOC FU Call Status:: Successful TOC FU Call Competed TOC FU Call Complete Date: 05/09/23  Transition Care Management Follow-up Telephone Call Date of Discharge: 05/05/23 Discharge Facility: Other ([facility]) Name of Other ([facility]) Discharge Facility: [facility] Type of Discharge: Inpatient Admission How have you been since you were released from the hospital?: Better Any questions or concerns?: No  Items Reviewed: Did you receive and understand the discharge instructions provided?: Yes Medications obtained,verified, and reconciled?: Yes (Medications Reviewed) Any new allergies since your discharge?: No Dietary orders reviewed?: Yes Do you have support at home?: Yes People in Home: spouse  Medications Reviewed Today: Medications Reviewed Today     Reviewed by Karena Addison, LPN (Licensed Practical Nurse) on 05/09/23 at 1712  Med List Status: <None>   Medication Order Taking? Sig Documenting Provider Last Dose Status Informant  buPROPion (WELLBUTRIN XL) 150 MG 24 hr tablet [id number]  Take 1 tablet by mouth once daily Cook, Jayce G, DO  Active   buPROPion (WELLBUTRIN XL) 300 MG 24 hr tablet [id number]  Take 1 tablet by mouth once daily Tommie Sams, DO  Active   cyclobenzaprine (FLEXERIL) 10 MG tablet [id number]  TAKE 1 TABLET BY MOUTH EVERY DAY AT BEDTIME AS NEEDED FOR MUSCLE SPASM Tommie Sams, DO  Active   DULoxetine (CYMBALTA) 60 MG capsule [id number]  Take 1 capsule by mouth once daily Cook, Jayce G, DO  Active   hydrochlorothiazide (HYDRODIURIL) 25 MG tablet [id number]  Take 1 tablet by mouth once daily Cook, Jayce G, DO  Active   hydrOXYzine (ATARAX) 25 MG tablet [id number]  Take 1 tablet (25 mg total) by mouth every 8 (eight) hours as needed. for anxiety Tommie Sams, DO  Active   omeprazole (PRILOSEC) 20 MG capsule [id number]  Take 1 capsule by mouth once  daily Tommie Sams, DO  Active   Semaglutide-Weight Management 0.25 MG/0.5ML Ivory Broad [id number]  Inject 0.25 mg into the skin once a week. Tommie Sams, DO  Active   traZODone (DESYREL) 50 MG tablet [id number]  Take 1 tablet (50 mg total) by mouth at bedtime as needed for sleep. Everlene Other G, DO  Active   valACYclovir (VALTREX) 500 MG tablet [id number]  Take 1 tablet by mouth twice daily Tommie Sams, DO  Active             Home Care and Equipment/Supplies: Were Home Health Services Ordered?: NA Any new equipment or medical supplies ordered?: NA  Functional Questionnaire: Do you need assistance with bathing/showering or dressing?: No Do you need assistance with meal preparation?: No Do you need assistance with eating?: No Do you have difficulty maintaining continence: No Do you need assistance with getting out of bed/getting out of a chair/moving?: No Do you have difficulty managing or taking your medications?: No  Follow up appointments reviewed: PCP Follow-up appointment confirmed?: No (transferred PCP) MD Provider Line Number:[phone] Given: No Specialist Hospital Follow-up appointment confirmed?: NA Do you need transportation to your follow-up appointment?: No Do you understand care options if your condition(s) worsen?: Yes-patient verbalized understanding    SIGNATURE Karena Addison, LPN [facility] Nurse Health Advisor Direct Dial [phone]

## 2023-05-30 ENCOUNTER — Other Ambulatory Visit: Payer: Self-pay | Admitting: Family Medicine

## 2023-08-13 ENCOUNTER — Other Ambulatory Visit: Payer: Self-pay | Admitting: Family Medicine

## 2023-08-29 ENCOUNTER — Other Ambulatory Visit: Payer: Self-pay | Admitting: Family Medicine

## 2024-01-25 ENCOUNTER — Other Ambulatory Visit: Payer: Self-pay | Admitting: Family Medicine

## 2024-05-09 ENCOUNTER — Other Ambulatory Visit: Payer: Self-pay | Admitting: Family Medicine

## 2024-05-09 ENCOUNTER — Other Ambulatory Visit: Payer: Self-pay

## 2024-05-09 MED ORDER — HYDROCHLOROTHIAZIDE 25 MG PO TABS: 25.0000 mg | ORAL_TABLET | Freq: Every day | ORAL | 2 refills | Status: AC
